# Patient Record
Sex: Female | Born: 1960 | Race: White | Hispanic: No | Marital: Single | Smoking: Former smoker
Health system: Southern US, Community
[De-identification: ages and names within clinical notes are randomized; demographics above are authoritative.]

## PROBLEM LIST (undated history)

## (undated) DIAGNOSIS — G8929 Other chronic pain: Secondary | ICD-10-CM

## (undated) DIAGNOSIS — E7800 Pure hypercholesterolemia, unspecified: Secondary | ICD-10-CM

## (undated) DIAGNOSIS — F32A Depression, unspecified: Secondary | ICD-10-CM

## (undated) DIAGNOSIS — F419 Anxiety disorder, unspecified: Secondary | ICD-10-CM

## (undated) DIAGNOSIS — M1990 Unspecified osteoarthritis, unspecified site: Secondary | ICD-10-CM

## (undated) DIAGNOSIS — I10 Essential (primary) hypertension: Secondary | ICD-10-CM

## (undated) DIAGNOSIS — K219 Gastro-esophageal reflux disease without esophagitis: Secondary | ICD-10-CM

## (undated) DIAGNOSIS — R519 Headache, unspecified: Secondary | ICD-10-CM

## (undated) DIAGNOSIS — C439 Malignant melanoma of skin, unspecified: Secondary | ICD-10-CM

## (undated) DIAGNOSIS — F319 Bipolar disorder, unspecified: Secondary | ICD-10-CM

## (undated) DIAGNOSIS — M797 Fibromyalgia: Secondary | ICD-10-CM

## (undated) DIAGNOSIS — F329 Major depressive disorder, single episode, unspecified: Secondary | ICD-10-CM

## (undated) DIAGNOSIS — R7301 Impaired fasting glucose: Secondary | ICD-10-CM

## (undated) DIAGNOSIS — I428 Other cardiomyopathies: Secondary | ICD-10-CM

## (undated) DIAGNOSIS — R569 Unspecified convulsions: Secondary | ICD-10-CM

## (undated) DIAGNOSIS — I219 Acute myocardial infarction, unspecified: Secondary | ICD-10-CM

## (undated) DIAGNOSIS — M549 Dorsalgia, unspecified: Secondary | ICD-10-CM

## (undated) HISTORY — PX: COLONOSCOPY: SHX174

## (undated) HISTORY — DX: Unspecified osteoarthritis, unspecified site: M19.90

## (undated) HISTORY — DX: Other cardiomyopathies: I42.8

## (undated) HISTORY — PX: ESOPHAGOGASTRODUODENOSCOPY: SHX1529

## (undated) HISTORY — DX: Unspecified convulsions: R56.9

## (undated) HISTORY — DX: Impaired fasting glucose: R73.01

## (undated) HISTORY — PX: OTHER SURGICAL HISTORY: SHX169

---

## 2004-10-23 ENCOUNTER — Ambulatory Visit: Payer: Self-pay | Admitting: Internal Medicine

## 2004-10-23 ENCOUNTER — Inpatient Hospital Stay (HOSPITAL_COMMUNITY): Admission: AD | Admit: 2004-10-23 | Discharge: 2004-10-27 | Payer: Self-pay | Admitting: Family Medicine

## 2004-12-20 HISTORY — PX: ABDOMINAL HYSTERECTOMY: SHX81

## 2005-02-10 ENCOUNTER — Inpatient Hospital Stay (HOSPITAL_COMMUNITY): Admission: RE | Admit: 2005-02-10 | Discharge: 2005-02-12 | Payer: Self-pay | Admitting: Obstetrics & Gynecology

## 2005-12-20 DIAGNOSIS — M1990 Unspecified osteoarthritis, unspecified site: Secondary | ICD-10-CM

## 2005-12-20 HISTORY — DX: Unspecified osteoarthritis, unspecified site: M19.90

## 2006-07-27 ENCOUNTER — Ambulatory Visit (HOSPITAL_COMMUNITY): Admission: RE | Admit: 2006-07-27 | Discharge: 2006-07-27 | Payer: Self-pay | Admitting: General Surgery

## 2006-09-19 DIAGNOSIS — R569 Unspecified convulsions: Secondary | ICD-10-CM

## 2006-09-19 HISTORY — DX: Unspecified convulsions: R56.9

## 2007-06-21 ENCOUNTER — Ambulatory Visit (HOSPITAL_COMMUNITY): Admission: RE | Admit: 2007-06-21 | Discharge: 2007-06-21 | Payer: Self-pay | Admitting: Obstetrics & Gynecology

## 2008-04-01 ENCOUNTER — Ambulatory Visit (HOSPITAL_COMMUNITY): Admission: RE | Admit: 2008-04-01 | Discharge: 2008-04-01 | Payer: Self-pay | Admitting: Family Medicine

## 2008-04-01 ENCOUNTER — Encounter: Payer: Self-pay | Admitting: Orthopedic Surgery

## 2008-06-12 ENCOUNTER — Ambulatory Visit: Payer: Self-pay | Admitting: Orthopedic Surgery

## 2008-06-17 ENCOUNTER — Telehealth: Payer: Self-pay | Admitting: Orthopedic Surgery

## 2008-06-27 ENCOUNTER — Ambulatory Visit (HOSPITAL_COMMUNITY): Admission: RE | Admit: 2008-06-27 | Discharge: 2008-06-27 | Payer: Self-pay | Admitting: Orthopedic Surgery

## 2008-06-27 ENCOUNTER — Telehealth: Payer: Self-pay | Admitting: Orthopedic Surgery

## 2008-07-08 ENCOUNTER — Ambulatory Visit: Payer: Self-pay | Admitting: Orthopedic Surgery

## 2008-07-12 ENCOUNTER — Ambulatory Visit (HOSPITAL_COMMUNITY): Admission: RE | Admit: 2008-07-12 | Discharge: 2008-07-12 | Payer: Self-pay | Admitting: Orthopedic Surgery

## 2008-07-12 ENCOUNTER — Ambulatory Visit: Payer: Self-pay | Admitting: Orthopedic Surgery

## 2008-07-16 ENCOUNTER — Encounter (HOSPITAL_COMMUNITY): Admission: RE | Admit: 2008-07-16 | Discharge: 2008-08-15 | Payer: Self-pay | Admitting: Orthopedic Surgery

## 2008-07-16 ENCOUNTER — Ambulatory Visit: Payer: Self-pay | Admitting: Orthopedic Surgery

## 2008-08-01 ENCOUNTER — Encounter: Payer: Self-pay | Admitting: Orthopedic Surgery

## 2008-08-06 ENCOUNTER — Ambulatory Visit: Payer: Self-pay | Admitting: Orthopedic Surgery

## 2008-09-18 ENCOUNTER — Telehealth: Payer: Self-pay | Admitting: Orthopedic Surgery

## 2008-12-20 DIAGNOSIS — C439 Malignant melanoma of skin, unspecified: Secondary | ICD-10-CM

## 2008-12-20 HISTORY — DX: Malignant melanoma of skin, unspecified: C43.9

## 2009-01-28 ENCOUNTER — Ambulatory Visit (HOSPITAL_COMMUNITY): Admission: RE | Admit: 2009-01-28 | Discharge: 2009-01-28 | Payer: Self-pay | Admitting: Surgery

## 2009-01-29 ENCOUNTER — Ambulatory Visit (HOSPITAL_COMMUNITY): Admission: RE | Admit: 2009-01-29 | Discharge: 2009-01-29 | Payer: Self-pay | Admitting: Family Medicine

## 2009-02-04 ENCOUNTER — Ambulatory Visit (HOSPITAL_BASED_OUTPATIENT_CLINIC_OR_DEPARTMENT_OTHER): Admission: RE | Admit: 2009-02-04 | Discharge: 2009-02-04 | Payer: Self-pay | Admitting: Surgery

## 2009-02-04 ENCOUNTER — Encounter (INDEPENDENT_AMBULATORY_CARE_PROVIDER_SITE_OTHER): Payer: Self-pay | Admitting: Surgery

## 2009-08-19 ENCOUNTER — Ambulatory Visit (HOSPITAL_COMMUNITY): Admission: RE | Admit: 2009-08-19 | Discharge: 2009-08-19 | Payer: Self-pay | Admitting: Obstetrics & Gynecology

## 2010-01-07 ENCOUNTER — Ambulatory Visit (HOSPITAL_COMMUNITY): Admission: RE | Admit: 2010-01-07 | Discharge: 2010-01-07 | Payer: Self-pay | Admitting: Family Medicine

## 2010-02-27 ENCOUNTER — Ambulatory Visit (HOSPITAL_COMMUNITY): Admission: RE | Admit: 2010-02-27 | Discharge: 2010-02-27 | Payer: Self-pay | Admitting: Family Medicine

## 2010-07-27 ENCOUNTER — Ambulatory Visit: Payer: Self-pay | Admitting: Gastroenterology

## 2010-07-28 ENCOUNTER — Encounter: Payer: Self-pay | Admitting: Internal Medicine

## 2010-08-20 HISTORY — PX: COLONOSCOPY: SHX174

## 2010-09-02 ENCOUNTER — Encounter: Payer: Self-pay | Admitting: Internal Medicine

## 2010-09-16 ENCOUNTER — Ambulatory Visit: Payer: Self-pay | Admitting: Internal Medicine

## 2010-09-16 ENCOUNTER — Ambulatory Visit (HOSPITAL_COMMUNITY): Admission: RE | Admit: 2010-09-16 | Discharge: 2010-09-16 | Payer: Self-pay | Admitting: Internal Medicine

## 2010-09-21 ENCOUNTER — Ambulatory Visit (HOSPITAL_COMMUNITY): Admission: RE | Admit: 2010-09-21 | Discharge: 2010-09-21 | Payer: Self-pay | Admitting: Family Medicine

## 2011-01-10 ENCOUNTER — Encounter: Payer: Self-pay | Admitting: Family Medicine

## 2011-01-21 NOTE — Letter (Signed)
Summary: TCS ORDER  TCS ORDER   Imported By: Ave Filter 09/02/2010 09:24:54  _____________________________________________________________________  External Attachment:    Type:   Image     Comment:   External Document

## 2011-01-21 NOTE — Letter (Signed)
Summary: REFERRAL FROM DR Lilyan Punt  REFERRAL FROM DR Lilyan Punt   Imported By: Rexene Alberts 07/28/2010 12:08:43  _____________________________________________________________________  External Attachment:    Type:   Image     Comment:   External Document

## 2011-01-21 NOTE — Assessment & Plan Note (Signed)
Summary: anemia,consult for tcs/ss   Visit Type:  Initial Consult Referring Provider:  Lilyan Punt Primary Care Provider:  Lilyan Punt  Chief Complaint:  anemia/consult for tcs.  History of Present Illness: 50 y/o caucasian female here for further evaluation of anemia.  Pt recalls being anemic intermittantly since middle school.  Hemoccult stools x 3 negative per pt.  Denies rectal bleeding or melena.  c/o hard stools twice per week.  Hx GERD on Prilosec daily asymptomatic.  Denies dysphagia/odynophagia.  Wt steadily increasing, gained 11# IN 3 weeks.  Denies hx blood transfusion or donation.  c/o easy bruising.  Denies epistaxis.  c/o occ fatigue. Excedrin Back & Body (250mg  ASA per cap) 4 daily 3 times/week.  Naprosyn 1-2 per week.    1/21 Hgb 11.7 ferritin 62 5/16 Hgb 11.3 6/9 Hgb 10.7, eos 6% LFTs normal  Current Problems (verified): 1)  Anemia, Normocytic, Chronic  (ICD-285.9) 2)  Knee Pain  (ICD-719.46) 3)  Knee, Arthritis, Degen.Lanetta Inch  (ICD-715.96) 4)  Derangement Meniscus  (ICD-717.5) 5)  Family History of Arthritis  (ICD-V17.7)  Current Medications (verified): 1)  K-Lor 20 Meq  Pack (Potassium Chloride) .... As Needed 2)  Hydrochlorothiazide 25 Mg  Tabs (Hydrochlorothiazide) .... As Needed 3)  Nortriptyline Hcl 50 Mg  Caps (Nortriptyline Hcl) .... One Tablet At Bedtime 4)  Omeprazole 20 Mg  Cpdr (Omeprazole) .... Take 1 Tablet By Mouth Once A Day 5)  Lamotrigine 25 Mg Tabs (Lamotrigine) .... Two Tablets Every Am 6)  Estradiol 2 Mg Tabs (Estradiol) .... One Tablet Daily 7)  Alprazolam 1 Mg Tabs (Alprazolam) .... Take 1 Tablet By Mouth Four Times A Day 8)  Fenofibrate Micronized 134 Mg Caps (Fenofibrate Micronized) .... Take 1 Tablet By Mouth Once A Day 9)  Trazodone Hcl 50 Mg Tabs (Trazodone Hcl) .... Take 1 Tab By Mouth At Bedtime 10)  Pravachol 20 Mg Tabs (Pravastatin Sodium) .... One Tablet in The Evening 11)  Equate Vison Formula .... One Tablet At Bedtime 12)   Naprosyn 500 Mg Tabs (Naproxen) .... One Tablet Daily As Needed 13)  Meclizine Hcl 25 Mg Tabs (Meclizine Hcl) .... As Needed 14)  Excedrin Back & Body 250-250 Mg Tabs (Acetaminophen-Aspirin Buffered) .... Two Tablets in The Am and Two  Tablets in The Pm  Allergies (verified): No Known Drug Allergies  Past History:  Past Medical History: migraines htn Bipolar depression (Dr Thomasena Edis) anxiety ANEMIA, NORMOCYTIC, CHRONIC (ICD-285.9) KNEE PAIN (ICD-719.46) KNEE, ARTHRITIS, DEGEN./OSTEO (ICD-715.96) DERANGEMENT MENISCUS (ICD-717.5) melanoma right knee seizure c diff 2005 on colonoscopy Dr Jena Gauss 10/2004 erosive reflux esophagitis EGD same date  Past Surgical History: complete hysterectomy knee surgery melanoma excision right leg  Family History: Family History of Arthritis Father (70) non-alcoholic, elevated liver enzymes? Mother/aunt/son-IBS  Social History: Patient is single, divorced x2 1 son-healthy disabled lives w/ boyfriend Patient is a former smoker. quit 10/99, 8pkyr Illicit Drug Use - yes, occ marijuana Alcohol Use - no Patient gets regular exercise. Smoking Status:  quit Does Patient Exercise:  yes Drug Use:  yes  Review of Systems General:  Denies fever, chills, sweats, anorexia, weakness, malaise, weight loss, and sleep disorder. CV:  Denies chest pains, angina, palpitations, syncope, dyspnea on exertion, orthopnea, PND, peripheral edema, and claudication. Resp:  Denies dyspnea at rest, dyspnea with exercise, cough, sputum, wheezing, coughing up blood, and pleurisy. GI:  Denies jaundice and fecal incontinence. GU:  Denies urinary burning, blood in urine, nocturnal urination, urinary frequency, urinary incontinence, and abnormal vaginal bleeding. MS:  Complains of joint pain / LOM and low back pain; denies joint swelling, joint stiffness, joint deformity, muscle weakness, muscle cramps, muscle atrophy, leg pain at night, leg pain with exertion, and shoulder  pain / LOM hand / wrist pain (CTS). Derm:  Denies rash, itching, dry skin, hives, moles, warts, and unhealing ulcers. Psych:  Denies depression, anxiety, memory loss, suicidal ideation, hallucinations, paranoia, phobia, and confusion. Heme:  Denies enlarged lymph nodes.  Vital Signs:  Patient profile:   50 year old female Height:      68 inches Weight:      210 pounds BMI:     32.05 Temp:     98.4 degrees F oral Pulse rate:   84 / minute BP sitting:   110 / 70  (left arm) Cuff size:   regular  Vitals Entered By: Cloria Spring LPN (July 27, 2010 2:08 PM)  Physical Exam  General:  obese.  pale.nad. Head:  normocephalic and atraumatic Eyes:  Sclera clear, no icterus. Ears:  Normal auditory acuity. Nose:  No deformity, discharge,  or lesions. Mouth:  No deformity or lesions, dentition normal. Neck:  Supple; no masses or thyromegaly. Lungs:  Clear throughout to auscultation. Heart:  Regular rate and rhythm; no murmurs, rubs,  or bruits. Abdomen:  Soft, nontender and nondistended. No masses, hepatosplenomegaly or hernias noted. Normal bowel sounds.obese, without guarding, and without rebound.   Rectal:  Normal exam.hemocult negative.   Msk:  Symmetrical with no gross deformities. Normal posture. Pulses:  Normal pulses noted. Extremities:  No clubbing, cyanosis, edema or deformities noted. Neurologic:  Alert and  oriented x4;  grossly normal neurologically. Skin:  Intact without significant lesions or rashes. Cervical Nodes:  No significant cervical adenopathy. Psych:  Alert and cooperative. Normal mood and affect.  Impression & Recommendations:  Problem # 1:  ANEMIA, NORMOCYTIC, CHRONIC (ICD-285.9) 50 y/o caucasian female w/ chronic normocytic anemia and 1 gram drop of hgb over the course of past 6 months.  Ferritin, B12 & TIBC pending.  Hemoccult negative x 4 now.  No evidence of gross/occult GI bleeding or iron deficiency at this time.  Most likely anemia of chronic disease.   Cannot r/o PUD, obscure GI bleed, SB avms, NSAID-enteropathy without further evaluation given significant ASA use, malignancy,r recurrent melanoma.     If evidence of IDA or heme positive, would need colonoscopy, followed by EGD/SB capsule if no source is found.  If heme negative, without evidence of IDA, would consider hematology/oncology evaluation.  Await pending labs.  Orders: Hemoccult Guaiac-1 spec.(in office) ([id number])  Other Orders: Consultation Level III (16109) Consultation Level III (60454)  Patient Instructions: 1)  Tylenol Arthritis instead of excedrin 2)  request above labs from Dr Fletcher Anon office  Appended Document: anemia,consult for tcs/ss Please see if ferritin, B12, TIBC have been done at Dr Fletcher Anon.  Thanks  Appended Document: anemia,consult for tcs/ss LABS ARE IN YOUR BOX AS OF TODAY  Appended Document: anemia,consult for tcs/ss 6/21 iron 116, ferritin 58, b12 450, tibc normal, Hgb 10.7  Appended Document: anemia,consult for tcs/ss Discussed w/ RMR.  Pt needs colonscopy w/ him.  Colonoscopy to be performed by Dr. Suszanne Conners Rourk in the near future.  I have discussed risks and benefits which include, but are not limited to, bleeding, infection, perforation, or medication reaction.  The patient agrees with this plan and consent will be obtained.  Please schedule  Appended Document: anemia,consult for tcs/ss Pt scheduled for tcs 09/16/10@12 :30  Pt aware of appt.

## 2011-04-06 LAB — POCT HEMOGLOBIN-HEMACUE: Hemoglobin: 13.1 g/dL (ref 12.0–15.0)

## 2011-05-04 NOTE — H&P (Signed)
Tara Dickson, Tara Dickson             ACCOUNT NO.:  [id number]   MEDICAL RECORD NO.:  [id number]          PATIENT TYPE:  AMB   LOCATION:  DAY                           FACILITY:  [facility]   PHYSICIAN:  Vickki Hearing, M.D.DATE OF BIRTH:  [birth year]   DATE OF ADMISSION:  DATE OF DISCHARGE:  LH                              HISTORY & PHYSICAL   CHIEF COMPLAINT:  Left knee pain.   HISTORY:  This is a 50 year old female who presented with complaints of  pain in the medial side of the left knee with trouble doing routine  activities.  She had pain for over 6 months without any injury.  This  was associated with intermittent swelling.  She was treated with Lodine,  but due to a Hemoccult test pending, she came off the Lodine.  She  denies any serious back pain, just mild pain after standing for long  periods.  She complains that the knee does give out at times.  She says  that something needs to be done because I can't do my normal chores.  X-rays were done on April 01, 2008.  They showed no major findings.  She  did have an MRI on June 27, 2008 which showed an indistinct radial tear  of the posterior horn of the medial meniscus with small knee effusion.  There was increased signal on T2 weighted images along the deep fibers  of the medial collateral ligament.  There was mild patellofemoral  chondromalacia on the medial facet and thinning of the cartilage on the  medial side of the joint.   ALLERGIES:  She has no known drug allergies.   PAST MEDICAL HISTORY:  1. History of migraine headaches.  2. Hypertension.  3. Depression.  4. Anxiety.   SURGERIES:  Hysterectomy.   FAMILY HISTORY:  Arthritis and cancer.   SOCIAL HISTORY:  She is single.  She does not smoke or drink.   REVIEW OF SYSTEMS:  Weight gain, shortness of breath, fatigue,  dizziness, weakness and migraine headaches.  Every other system was  normal.   MEDICATIONS:  1. Premarin 0.625 mg a day.  2. Seroquel 50 mg a  day.  3. K-Lor 20 mEq a day.  4. Hydrochlorothiazide 25 mg a day.  5. Nortriptyline 50 mg a day.  6. Depakote ER 500 mg a day.  7. Alprazolam 0.5 mg q.8h. p.r.n.  8. Omeprazole 20 mg a day.  9. Iron supplements.  10.Fish oil.  11.EstraBlend supplement.  12.Lysine.   PHYSICAL EXAMINATION:  VITAL SIGNS:  Normal.  GENERAL APPEARANCE:  She is well-developed, well-nourished.  Normal body  habitus.  No deformities.  Normal grooming.  HEENT:  Head normocephalic,  atraumatic.  Eyes - pupils equal, round and reactive to light and  accommodation.  Extraocular movements intact.  Fundi benign.  Conjunctivae, sclerae clear.  Ears - tympanic membranes intact and  clear.  Nose - no deformity, discharge, inflammation or lesions.  Mouth  - no lesions normal to dentition.  NECK:  No masses.  No thyromegaly.  No abnormal nodes.  LUNGS:  Clear bilaterally to auscultation.  HEART:  Rate and rhythm normal.  ABDOMEN:  Bowel sounds normal, soft, nontender abdomen with no masses or  organomegaly.  Pulses are normal in all four extremities with no  clubbing, cyanosis, edema.  NEUROLOGIC:  No focal deficits on neurologic exam.  Cranial nerves  intact.  Reflexes and coordination normal.  Muscle strength and muscle  tone normal.  SKIN:  Intact with no lesions.  PSYCHIATRIC EVALUATION:  She is alert, cooperative.  Normal mood,  affect, attention span and concentration.  EXTREMITIES:  Right knee examination normal.  Left knee examination  reveals full range of motion with a positive McMurray sign and medial  joint line tenderness. The ligamnent exam was normal. There was  excellent motor function   IMPRESSION:  Torn medial meniscus of the left knee with mild  degenerative arthritis.   PLAN:  Arthroscopy of the left knee, partial medial meniscectomy.   Informed consent process was discussed with the patient including risk  of bleeding, infection, neurovascular injury.  Diagnosis and reason for  surgery  has been explained.  The patient has been given the option of  further nonoperative treatment.  She should expect 90% pain relief.  There is a chance of stiffness and swelling.   Surgery is scheduled for Friday with a postop on Tuesday.      Vickki Hearing, M.D.  Electronically Signed     SEH/MEDQ  D:  07/11/2008  T:  07/11/2008  Job:  [id number]   cc:   [facility]

## 2011-05-04 NOTE — Op Note (Signed)
Tara Dickson, Tara Dickson             ACCOUNT NO.:  [id number]   MEDICAL RECORD NO.:  [id number]          PATIENT TYPE:  AMB   LOCATION:  DAY                           FACILITY:  [facility]   PHYSICIAN:  Vickki Hearing, M.D.DATE OF BIRTH:  Nov 20, 1961   DATE OF PROCEDURE:  07/12/2008  DATE OF DISCHARGE:                               OPERATIVE REPORT   This patient presented as a 50 year old female with atraumatic onset of  medial pain in her left knee.  She was treated with Lodine and Vicodin,  did not improve.  She had pain for over 6 months.  Her symptoms got  worse and started having swelling, and inability to do her activities of  daily living.  She eventually had an MRI of the knee, which showed a  torn posterior horn of the medial meniscus, small knee effusion,  patellofemoral chondromalacia on the medial side, and thinning of the  medial compartment articular cartilage.  After failure of nonoperative  treatment, she presented for surgical treatment.   PREOPERATIVE DIAGNOSIS:  Torn medial meniscus of the left knee with  degenerative arthritis.   POSTOPERATIVE DIAGNOSIS:  Torn medial meniscus of the left knee with  degenerative arthritis.   PROCEDURES:  1. Arthroscopy of the left knee with partial medial meniscectomy  2. Chondroplasty of the medial facet of the patella.   SURGEON:  Vickki Hearing, MD.   ASSISTANT:  No assistants.   ANESTHETIC:  General.   OPERATIVE FINDINGS:  The posterior horn medial meniscus was torn.  It  was grade 1 chondromalacia of the medial femoral condyle and tibial  plateau.  It was grade 2 chondromalacia of the medial facet of the  patella.  The ACL, PCL, and lateral compartment was intact.  There was a  symptomatic medial plica.   SPECIMEN:  No specimens.  Case was clean minimal.   ESTIMATED BLOOD LOSS:  No blood loss.   COMPLICATIONS:  No complications.   COUNTS:  Correct.  The patient went to PACU in good condition.   TECHNIQUE:   The patient was first identified in the preop area as  Tara Dickson.  Her left knee was marked by her for surgery and  then I countersigned.  I checked her chart, updated her history and  physical.  She was given a gram of Ancef and taken to the operating room  where she had a general anesthetic was induced smoothly.  No  complications.   In the supine position with an arthroscopic leg holder and the right leg  was padded.  The left leg was prepped with DuraPrep and draped  sterilely.   Time-out was completed.   Lateral portal was established.  Diagnostic arthroscopy was performed  then repeated with a medial portal established and a probe to the medial  portal to palpate the intra-articular structures.  Findings included  torn medial meniscus on the posterior horn, there was grade 1  chondromalacia medial femoral condyle, and there was grade 2  chondromalacia medial facet of the patella.  There was a symptomatic  plica, which did not abrade the femoral condyle  on flexion and extension   Combination of arthroscopic instruments arthroscopic shaver and a  biters, an ArthroCare 150 degrees was used to resect a meniscal tear  balance of the meniscus and a chondroplasty was performed on the medial  facet of patella with a shaver.  The knee was irrigated and suctioned,  cleaned out of any debris, and then the portals were closed with 3-0  nylon.  A 20 mL of 0.5% Sensorcaine with 1:200,000 epinephrine solution  was injected into the knee.  The knee was dressed sterilely, cryo cuff  was applied.  The patient was extubated and taken to the recovery room  in stable condition.  She has a postop appointment for Tuesday.  She  will be discharged on Lorcet Plus and Phenergan.  She is full  weightbearing.      Vickki Hearing, M.D.  Electronically Signed     SEH/MEDQ  D:  07/12/2008  T:  07/12/2008  Job:  [id number]

## 2011-05-04 NOTE — Op Note (Signed)
NAMEJESSY, Tara Dickson             ACCOUNT NO.:  [id number]   MEDICAL RECORD NO.:  [id number]          PATIENT TYPE:  AMB   LOCATION:  DSC                          FACILITY:  [facility]   PHYSICIAN:  Thornton Park. Daphine Deutscher, MD  DATE OF BIRTH:  Mar 21, 1961   DATE OF PROCEDURE:  02/04/2009  DATE OF DISCHARGE:                               OPERATIVE REPORT   PREOPERATIVE DIAGNOSIS:  A 3.5 mm melanoma in the right popliteal  region.   PROCEDURE:  Right inguinal sentinel lymph node mapping and sentinel  lymph node biopsy, rotation from supine to prone position, and wide  excision with 2-cm margins of melanoma site, right popliteal space with  partial closure and split-thickness skin grafting from posterior aspect.   SURGEON:  Thornton Park. Daphine Deutscher, MD   ANESTHESIA:  General endotracheal in both supine and prone positions.   DESCRIPTION OF PROCEDURE:  The patient was taken to room 5 at [facility].  On February 04, 2009, she was given general anesthesia.  Preoperative informed consent was obtained regarding wide excision with  probable split-thickness skin grafting and lymph node mapping and  biopsy.  First we prepped the groin where I had previously mapped the  groin region and found a hot area.  Once this was prepped and draped, I  made a transverse incision and carried this down to generous fatty  tissue to find a hot node.  I then previously injected with some blue  dye and little bit of blue dye was in the node as well.  It was excised  using clips to control any kind of the lymphovascular bleeding and none  was present.  It was hot when I removed it and there was background and  zero when it was gone.  It was sent for permanent sections.  The wound  was then closed in layers with 4-0 Vicryl and then with Dermabond.   The patient was then rotated onto a new operative bed in the prone  position.  I carefully mapped out the melanoma and excised it with a 2-  cm margin.  This was  unfortunately in the crease of her leg but I went  ahead and excised it down to the fascia and got good margins and  oriented those with a marking suture laterally.  It was taken off the  field.  It was then closed at either end for a distance of approximately  1 cm.  A 2-inch wide 6-cm long graft was then harvested from the  posterior aspect of the thigh.  A nice and very good 10,000th of an inch  of graft was obtained and were run to the 1.5 x 0.1 measure and then  secured to the site with many interrupted 4-0 Vicryls.  Tisseel was used  at the end to help seal it to the wound and a bulky dressing was placed  in it and tied down with four 3-0 nylons.  This was tied down over a parachute silk and a bolus dressing.  The  entire area was then wrapped after Tegaderm was placed on the donor site  with an Ace wrap and was placed in a knee immobilizer.  The patient was  obstructed to come back and see me in 3 days in the office and was given  Tylox for pain.      Thornton Park Daphine Deutscher, MD  Electronically Signed     MBM/MEDQ  D:  02/04/2009  T:  02/04/2009  Job:  [id number]   cc:   Donna Bernard, M.D.  Nita Sells, M.D.

## 2011-05-04 NOTE — Procedures (Signed)
NAMECAMELIA, STELZNER             ACCOUNT NO.:  [id number]   MEDICAL RECORD NO.:  [id number]          PATIENT TYPE:  OUT   LOCATION:  RESP                          FACILITY:  [facility]   PHYSICIAN:  Kofi A. Gerilyn Pilgrim, M.D. DATE OF BIRTH:  1961-02-08   DATE OF PROCEDURE:  DATE OF DISCHARGE:  01/29/2009                              EEG INTERPRETATION   REFERRING PHYSICIAN:  Scott A. Gerda Diss, MD   HISTORY:  This is a 50 year old lady who presents with spells suspicious  for seizures.   MEDICATIONS:  Pamelor, Xanax, Seroquel, and Prilosec.   ANALYSIS:  A 16-channel recording is conducted for approximately 20  minutes.  There is a well-formed posterior rhythm of 13 Hz, which  attenuates with eye opening.  Awake and drowsy activities are recorded.  Photic stimulation and hyperventilation are carried out without  significant changes in the background activity.  There is no focal or  lateralized slowing.  There is no epileptiform activity.   IMPRESSION:  Normal recording.  A single recording does not rule out  epileptic seizures however.  If clinically indicated, a prolonged or  sleep deprived recording may be useful.      Kofi A. Gerilyn Pilgrim, M.D.  Electronically Signed     KAD/MEDQ  D:  02/03/2009  T:  02/03/2009  Job:  [id number]

## 2011-05-07 NOTE — Discharge Summary (Signed)
NAMECATY, TESSLER             ACCOUNT NO.:  [id number]   MEDICAL RECORD NO.:  [id number]          PATIENT TYPE:  INP   LOCATION:  A417                          FACILITY:  [facility]   PHYSICIAN:  Lazaro Arms, M.D.   DATE OF BIRTH:  Aug 18, 1961   DATE OF ADMISSION:  02/10/2005  DATE OF DISCHARGE:  02/24/2006LH                                 DISCHARGE SUMMARY   DISCHARGE DIAGNOSES:  1.  Status post total abdominal hysterectomy bilateral salpingo-      oophorectomy.  2.  Unremarkable postoperative course.   PROCEDURE:  TAH BSO.   HISTORY AND PHYSICAL:  Please refer the transcribed history and physical and  operative report for details of admission to the hospital.   HOSPITAL COURSE:  The patient was admitted after surgery, which went well.  She tolerated clear liquids and now a regular diet.  She is voiding without  symptoms.  She is ambulatory.  She is tolerating oral pain medicines.  Her  incision is clean, dry, and intact.  Her abdominal exam is normal  postoperatively.  She is discharged home this morning.   DISCHARGE MEDICATIONS:  1.  Tylox.  2.  Motrin.   FOLLOW UP:  In my office on Tuesday for an incision check and removal of  staples.   DISCHARGE INSTRUCTIONS:  She is given routine instructions and precautions.  Instructions for return or to call the office.      LHE/MEDQ  D:  02/12/2005  T:  02/12/2005  Job:  [id number]

## 2011-05-07 NOTE — Consult Note (Signed)
NAMEJAYLN, Tara Dickson             ACCOUNT NO.:  [id number]   MEDICAL RECORD NO.:  [id number]          PATIENT TYPE:  INP   LOCATION:  A340                          FACILITY:  [facility]   PHYSICIAN:  Scott A. Gerda Diss, MD    DATE OF BIRTH:  1961/03/17   DATE OF CONSULTATION:  11/19/2004  DATE OF DISCHARGE:  10/27/2004                                   CONSULTATION   DISCHARGE DIAGNOSES:  1.  Clostridium difficile toxin intestinal infection.  2.  Gastroenteritis secondary to #1.  3.  Hypokalemia.  4.  Reflux.  5.  Anemia secondary to menorrhagia.  6.  Bronchopneumonia.14   HOSPITAL COURSE:  The patient was admitted in with vomiting, coughing, not  feeling good, diarrhea, abdominal pain and discomfort.  She had been seen in  the office a week prior to admission with nausea and discomfort.  She was  seen twice in the emergency department.  Essentially, she was a failed  outpatient management.  The patient came in and had a chest x-ray on the 4th  that showed a pneumonia.  She had blood work that showed a 17,000 white  count.  Her potassium was 3.2.  Her urine had some blood in it.  She was  admitted in and put on IV fluids as well as having antibiotics and had a  scan done.  It showed a thickened colon consistent with colitis.  Stool  tests were ordered.  Clostridium difficile toxin came back positive.  She  was treated with Flagyl.  She also had a colonoscopy done which showed a  normal colon with mild inflammatory changes associated with a localized left-  sided colitis, and an EGD which overall did not show any ulcers.  She  gradually improved, and she was able to be discharged to home on Protonix  once twice a day, and then also to Flagyl.  She was a follow up in the  office in one to two weeks, to be rechecked sooner if any problems.  She was  to avoid tomato base products, caffeine and chocolates.  As should be noted,  her leukocytosis did improve.     Scot   SAL/MEDQ  D:   11/19/2004  T:  11/19/2004  Job:  [id number]

## 2011-05-07 NOTE — Group Therapy Note (Signed)
NAMELAKEISA, HENINGER             ACCOUNT NO.:  [id number]   MEDICAL RECORD NO.:  [id number]          PATIENT TYPE:  INP   LOCATION:  A340                          FACILITY:  [facility]   PHYSICIAN:  Scott A. Gerda Diss, MD    DATE OF BIRTH:  1961/09/03   DATE OF PROCEDURE:  10/26/2004  DATE OF DISCHARGE:                                   PROGRESS NOTE   The patient overall feels pretty good.  Has some soreness in the left side  of her abdomen.  Denies any nausea, vomiting.  Her Clostridium difficile was  positive.  Will make sure she is on Flagyl.  The other medicine can be  discontinued.  She has an EGD and colonoscopy today.  Expect discharge  tomorrow.     Scot   SAL/MEDQ  D:  10/26/2004  T:  10/26/2004  Job:  [id number]

## 2011-06-28 ENCOUNTER — Ambulatory Visit (HOSPITAL_COMMUNITY)
Admission: RE | Admit: 2011-06-28 | Discharge: 2011-06-28 | Disposition: A | Discharge status: Home / Self Care | Payer: Medicare Other | Source: Ambulatory Visit | Attending: Family Medicine | Admitting: Family Medicine

## 2011-06-28 ENCOUNTER — Other Ambulatory Visit: Payer: Self-pay | Admitting: Family Medicine

## 2011-06-28 DIAGNOSIS — R05 Cough: Secondary | ICD-10-CM | POA: Insufficient documentation

## 2011-06-28 DIAGNOSIS — Z85820 Personal history of malignant melanoma of skin: Secondary | ICD-10-CM

## 2011-06-28 DIAGNOSIS — R059 Cough, unspecified: Secondary | ICD-10-CM | POA: Insufficient documentation

## 2011-09-01 IMAGING — CR DG LUMBAR SPINE COMPLETE 4+V
5 series · 5 of 5 positions shown · non-contrast
Comparison: None

CLINICAL DATA: Lumbar/low back pain

LUMBAR SPINE - COMPLETE 4+ VIEW

[view not recorded (1 of 5)]
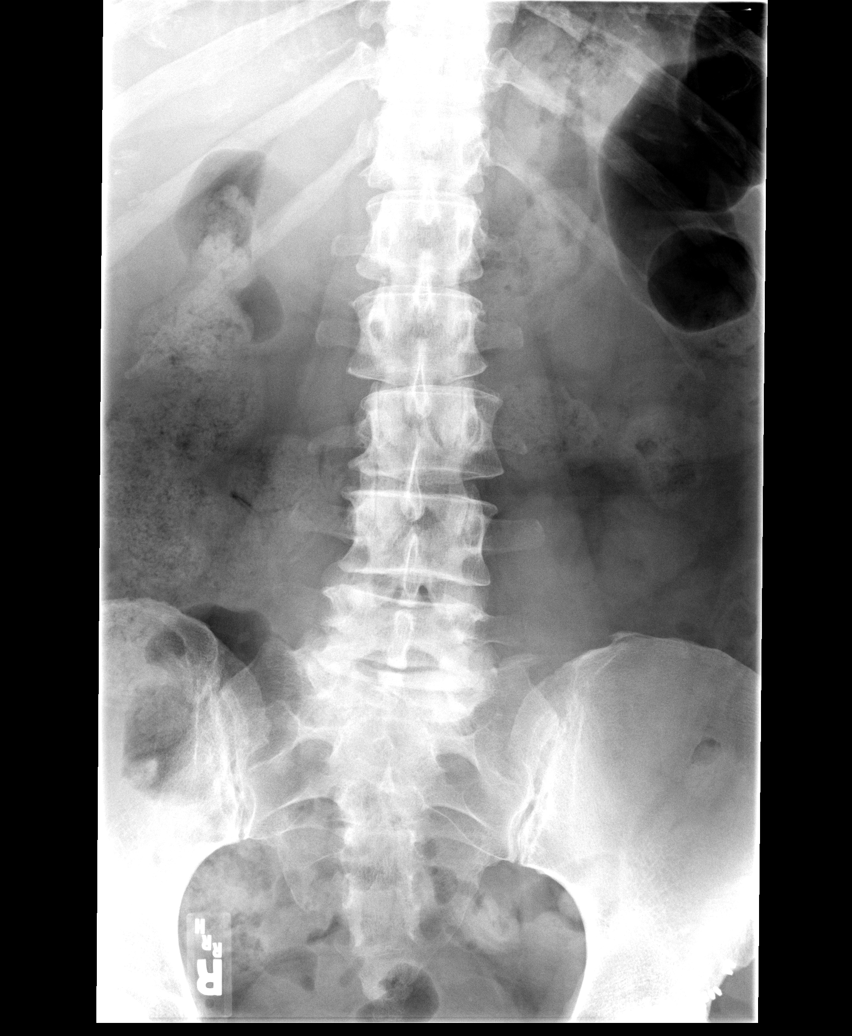

[view not recorded (2 of 5)]
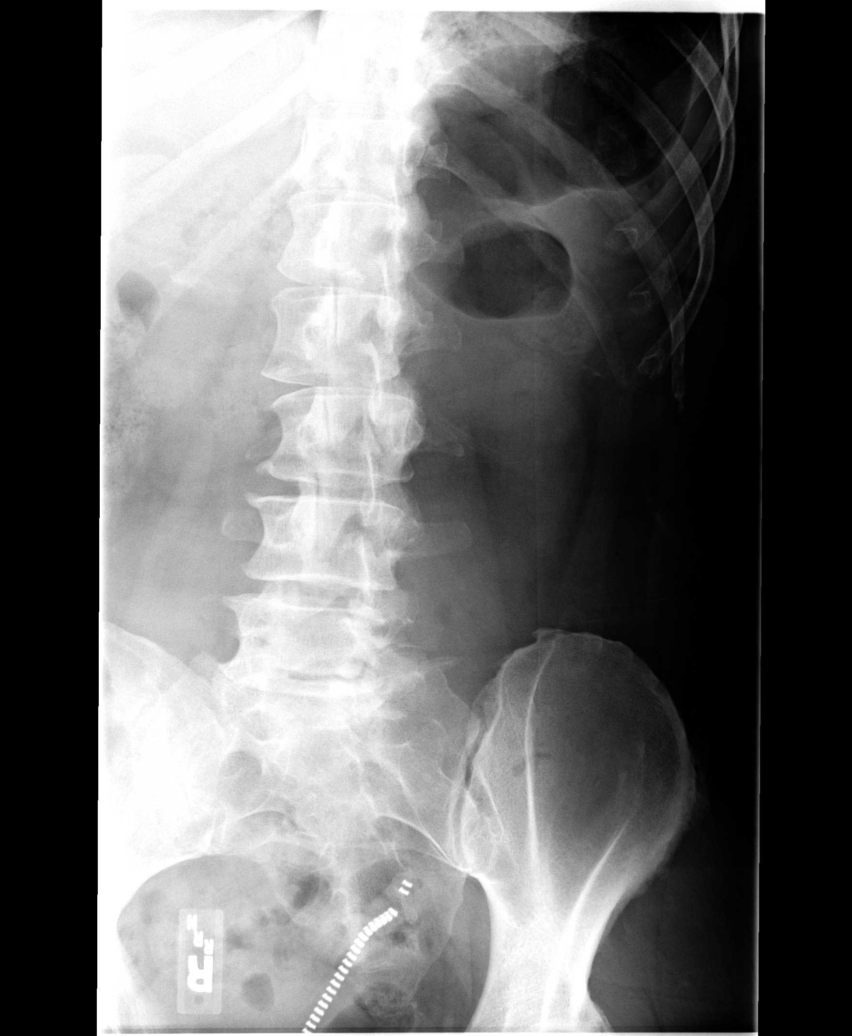

[view not recorded (3 of 5)]
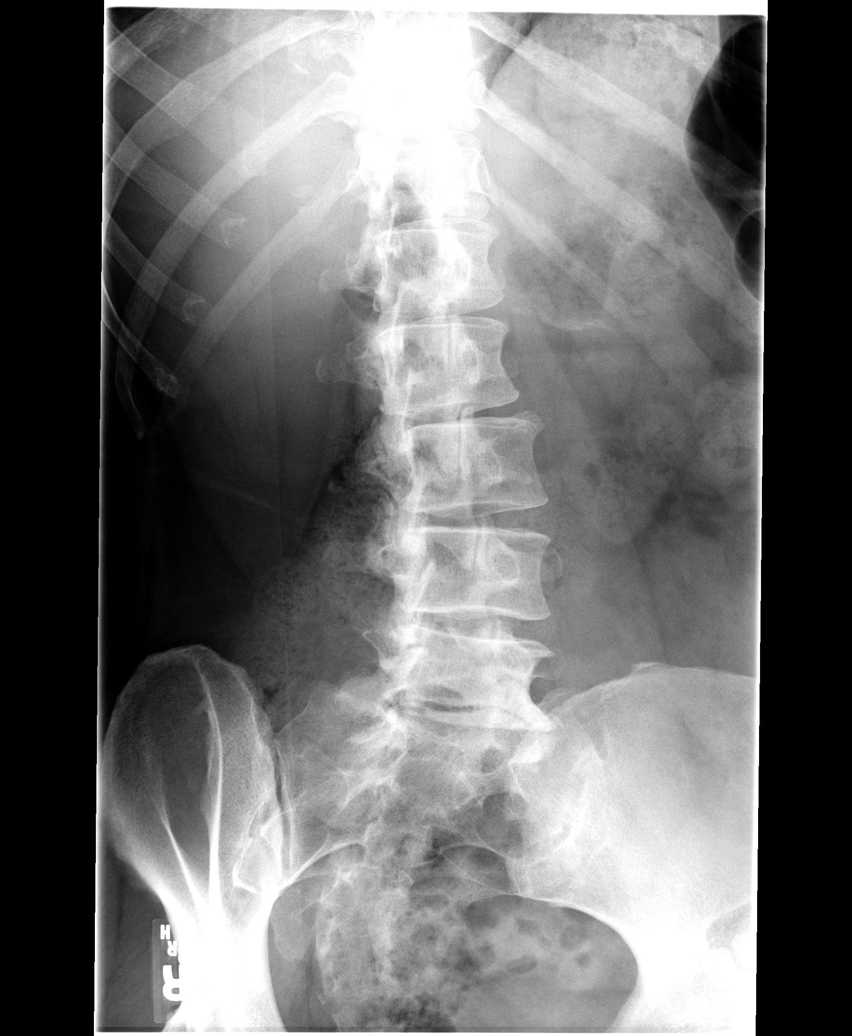

[view not recorded (4 of 5)]
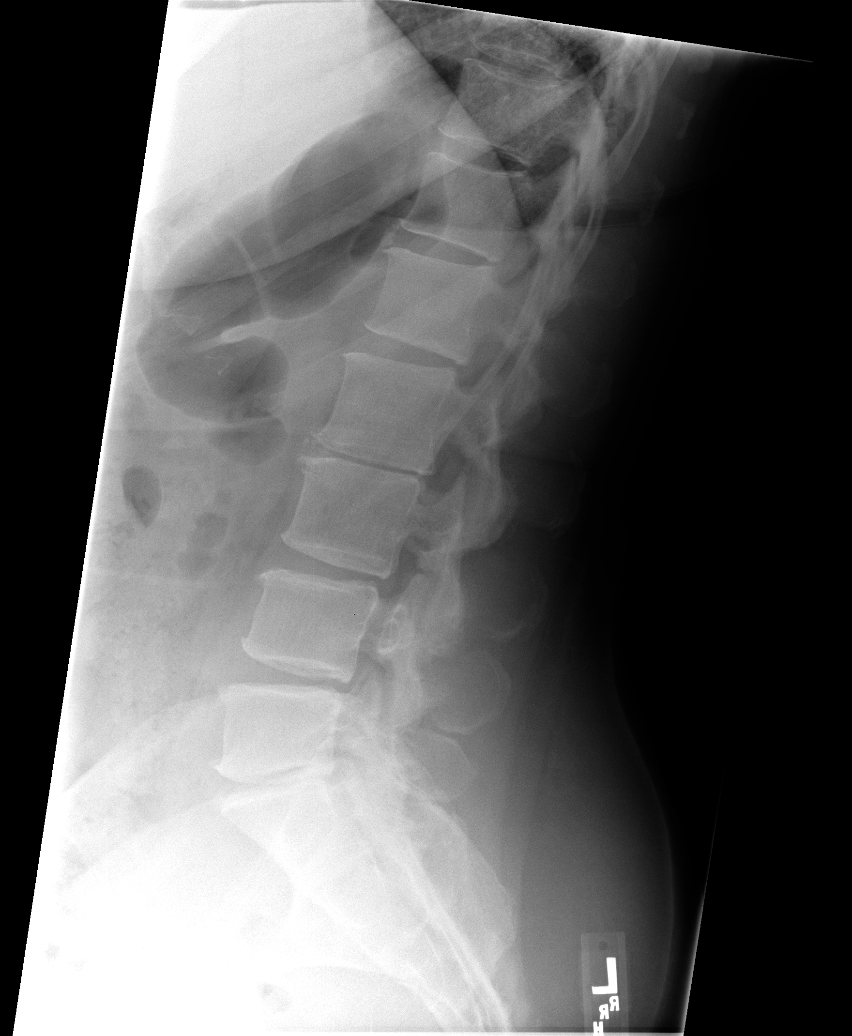

[view not recorded (5 of 5)]
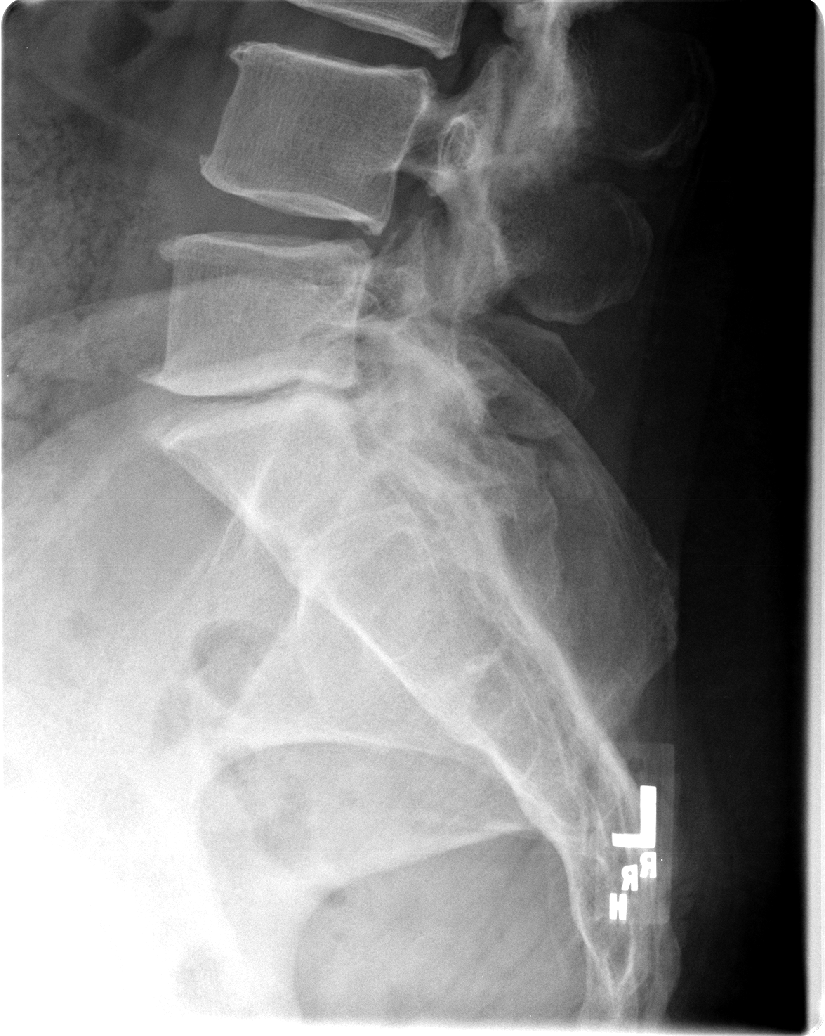

[5 of 5 positions shown; findings below may reference images not displayed]

FINDINGS: Five non-rib bearing lumbar type vertebrae.
Minimal levoconvex scoliosis lumbar spine apex L3.
Bones appear slightly demineralized.
Disc space narrowing at L2-L3, L5-S1, less at remaining levels.
Vertebral body heights maintained without fracture or subluxation.
No spondylolysis.
Minimal scattered end plate spur formation.
Facet degenerative changes lower lumbar spine.
SI joints symmetric.
IMPRESSION: Degenerative disc and facet disease changes of the lumbar spine.
Minimal scoliosis.
No acute abnormalities.

## 2011-09-17 LAB — RAPID URINE DRUG SCREEN, HOSP PERFORMED
Amphetamines: NOT DETECTED
Barbiturates: NOT DETECTED
Benzodiazepines: POSITIVE — AB
Cocaine: NOT DETECTED
Opiates: POSITIVE — AB
Tetrahydrocannabinol: POSITIVE — AB

## 2011-09-17 LAB — BASIC METABOLIC PANEL
BUN: 8
CO2: 27
Calcium: 9.2
Chloride: 105
Creatinine, Ser: 0.87
GFR calc Af Amer: >60
GFR calc non Af Amer: >60
Glucose, Bld: 81
Potassium: 4.2
Sodium: 137

## 2011-09-17 LAB — HEMOGLOBIN AND HEMATOCRIT, BLOOD
HCT: 33.5 — ABNORMAL LOW
Hemoglobin: 11.5 — ABNORMAL LOW

## 2011-11-01 ENCOUNTER — Other Ambulatory Visit: Payer: Self-pay | Admitting: Obstetrics & Gynecology

## 2011-11-01 DIAGNOSIS — Z139 Encounter for screening, unspecified: Secondary | ICD-10-CM

## 2011-11-09 ENCOUNTER — Ambulatory Visit (HOSPITAL_COMMUNITY)
Admission: RE | Admit: 2011-11-09 | Discharge: 2011-11-09 | Disposition: A | Discharge status: Home / Self Care | Payer: Medicare Other | Source: Ambulatory Visit | Attending: Obstetrics & Gynecology | Admitting: Obstetrics & Gynecology

## 2011-11-09 DIAGNOSIS — Z139 Encounter for screening, unspecified: Secondary | ICD-10-CM

## 2011-11-09 DIAGNOSIS — Z1231 Encounter for screening mammogram for malignant neoplasm of breast: Secondary | ICD-10-CM | POA: Insufficient documentation

## 2012-01-03 DIAGNOSIS — R011 Cardiac murmur, unspecified: Secondary | ICD-10-CM

## 2012-01-05 DIAGNOSIS — R079 Chest pain, unspecified: Secondary | ICD-10-CM

## 2012-10-03 ENCOUNTER — Other Ambulatory Visit: Payer: Self-pay | Admitting: Family Medicine

## 2012-10-03 DIAGNOSIS — IMO0001 Reserved for inherently not codable concepts without codable children: Secondary | ICD-10-CM

## 2012-11-09 ENCOUNTER — Ambulatory Visit (HOSPITAL_COMMUNITY)
Admission: RE | Admit: 2012-11-09 | Discharge: 2012-11-09 | Disposition: A | Discharge status: Home / Self Care | Payer: Medicare Other | Source: Ambulatory Visit | Attending: Family Medicine | Admitting: Family Medicine

## 2012-11-09 DIAGNOSIS — Z1231 Encounter for screening mammogram for malignant neoplasm of breast: Secondary | ICD-10-CM | POA: Insufficient documentation

## 2012-11-09 DIAGNOSIS — IMO0001 Reserved for inherently not codable concepts without codable children: Secondary | ICD-10-CM

## 2012-11-09 LAB — HM MAMMOGRAPHY

## 2012-12-20 DIAGNOSIS — I428 Other cardiomyopathies: Secondary | ICD-10-CM

## 2012-12-20 HISTORY — DX: Other cardiomyopathies: I42.8

## 2013-04-03 ENCOUNTER — Other Ambulatory Visit: Payer: Self-pay | Admitting: *Deleted

## 2013-07-27 ENCOUNTER — Other Ambulatory Visit: Payer: Self-pay | Admitting: Family Medicine

## 2013-07-30 ENCOUNTER — Other Ambulatory Visit: Payer: Self-pay | Admitting: Family Medicine

## 2013-09-19 DIAGNOSIS — I219 Acute myocardial infarction, unspecified: Secondary | ICD-10-CM

## 2013-09-19 HISTORY — DX: Acute myocardial infarction, unspecified: I21.9

## 2013-10-11 ENCOUNTER — Telehealth: Payer: Self-pay | Admitting: Family Medicine

## 2013-10-11 MED ORDER — METAXALONE 800 MG PO TABS: 800.0000 mg | ORAL_TABLET | Freq: Three times a day (TID) | ORAL | Status: DC

## 2013-10-11 NOTE — Telephone Encounter (Signed)
Skelaxin 800 one 3 times a day when necessary spasm cautioned drowsiness #21 followup if ongoing troubles

## 2013-10-11 NOTE — Telephone Encounter (Signed)
Patient says that when she woke up this morning she felt like she pulled something in her back. She said when she sits down she gets a sharp pain when she tries to get back up. She is hoping to have something called in to [facility]

## 2013-10-11 NOTE — Telephone Encounter (Signed)
Rx sent electronically to [facility]. Patient notified. 

## 2013-10-18 ENCOUNTER — Inpatient Hospital Stay (HOSPITAL_COMMUNITY)
Admission: AD | Admit: 2013-10-18 | Discharge: 2013-10-21 | DRG: 280 | Disposition: A | Discharge status: Home / Self Care | Payer: Medicare Other | Source: Other Acute Inpatient Hospital | Attending: Interventional Cardiology | Admitting: Interventional Cardiology

## 2013-10-18 ENCOUNTER — Encounter (HOSPITAL_COMMUNITY)
Admission: AD | Disposition: A | Discharge status: Home / Self Care | Payer: Self-pay | Source: Other Acute Inpatient Hospital | Attending: Interventional Cardiology

## 2013-10-18 ENCOUNTER — Encounter (HOSPITAL_COMMUNITY): Payer: Self-pay | Admitting: *Deleted

## 2013-10-18 DIAGNOSIS — I5021 Acute systolic (congestive) heart failure: Secondary | ICD-10-CM | POA: Diagnosis present

## 2013-10-18 DIAGNOSIS — I213 ST elevation (STEMI) myocardial infarction of unspecified site: Secondary | ICD-10-CM

## 2013-10-18 DIAGNOSIS — R4182 Altered mental status, unspecified: Secondary | ICD-10-CM | POA: Diagnosis present

## 2013-10-18 DIAGNOSIS — Z79899 Other long term (current) drug therapy: Secondary | ICD-10-CM

## 2013-10-18 DIAGNOSIS — I214 Non-ST elevation (NSTEMI) myocardial infarction: Secondary | ICD-10-CM | POA: Diagnosis present

## 2013-10-18 DIAGNOSIS — Z85820 Personal history of malignant melanoma of skin: Secondary | ICD-10-CM

## 2013-10-18 DIAGNOSIS — F319 Bipolar disorder, unspecified: Secondary | ICD-10-CM | POA: Diagnosis present

## 2013-10-18 DIAGNOSIS — J690 Pneumonitis due to inhalation of food and vomit: Secondary | ICD-10-CM | POA: Clinically undetermined

## 2013-10-18 DIAGNOSIS — E876 Hypokalemia: Secondary | ICD-10-CM | POA: Diagnosis present

## 2013-10-18 DIAGNOSIS — I509 Heart failure, unspecified: Secondary | ICD-10-CM | POA: Diagnosis present

## 2013-10-18 DIAGNOSIS — I2109 ST elevation (STEMI) myocardial infarction involving other coronary artery of anterior wall: Secondary | ICD-10-CM | POA: Diagnosis present

## 2013-10-18 DIAGNOSIS — R7989 Other specified abnormal findings of blood chemistry: Secondary | ICD-10-CM

## 2013-10-18 DIAGNOSIS — I059 Rheumatic mitral valve disease, unspecified: Secondary | ICD-10-CM

## 2013-10-18 DIAGNOSIS — F411 Generalized anxiety disorder: Secondary | ICD-10-CM | POA: Diagnosis present

## 2013-10-18 DIAGNOSIS — Z87891 Personal history of nicotine dependence: Secondary | ICD-10-CM

## 2013-10-18 DIAGNOSIS — I5181 Takotsubo syndrome: Secondary | ICD-10-CM

## 2013-10-18 DIAGNOSIS — I429 Cardiomyopathy, unspecified: Secondary | ICD-10-CM

## 2013-10-18 DIAGNOSIS — E870 Hyperosmolality and hypernatremia: Secondary | ICD-10-CM | POA: Diagnosis not present

## 2013-10-18 DIAGNOSIS — E785 Hyperlipidemia, unspecified: Secondary | ICD-10-CM | POA: Diagnosis present

## 2013-10-18 DIAGNOSIS — G40909 Epilepsy, unspecified, not intractable, without status epilepticus: Secondary | ICD-10-CM | POA: Diagnosis present

## 2013-10-18 DIAGNOSIS — I200 Unstable angina: Secondary | ICD-10-CM

## 2013-10-18 DIAGNOSIS — E669 Obesity, unspecified: Secondary | ICD-10-CM

## 2013-10-18 DIAGNOSIS — I10 Essential (primary) hypertension: Secondary | ICD-10-CM | POA: Diagnosis present

## 2013-10-18 HISTORY — DX: Major depressive disorder, single episode, unspecified: F32.9

## 2013-10-18 HISTORY — DX: Bipolar disorder, unspecified: F31.9

## 2013-10-18 HISTORY — PX: LEFT AND RIGHT HEART CATHETERIZATION WITH CORONARY ANGIOGRAM: SHX5449

## 2013-10-18 HISTORY — DX: Anxiety disorder, unspecified: F41.9

## 2013-10-18 HISTORY — DX: Depression, unspecified: F32.A

## 2013-10-18 HISTORY — DX: Essential (primary) hypertension: I10

## 2013-10-18 HISTORY — DX: Malignant melanoma of skin, unspecified: C43.9

## 2013-10-18 LAB — SEDIMENTATION RATE: Sed Rate: 37 mm/hr — ABNORMAL HIGH (ref 0–22)

## 2013-10-18 LAB — COMPREHENSIVE METABOLIC PANEL
ALT: 18 U/L (ref 0–35)
Albumin: 4 g/dL (ref 3.5–5.2)
Alkaline Phosphatase: 77 U/L (ref 39–117)
BUN: 13 mg/dL (ref 6–23)
Chloride: 103 mEq/L (ref 96–112)
Creatinine, Ser: 0.9 mg/dL (ref 0.50–1.10)
GFR calc Af Amer: 83 mL/min — ABNORMAL LOW (ref >90)
Glucose, Bld: 138 mg/dL — ABNORMAL HIGH (ref 70–99)
Potassium: 3.4 mEq/L — ABNORMAL LOW (ref 3.5–5.1)
Sodium: 141 mEq/L (ref 135–145)
Total Bilirubin: 0.6 mg/dL (ref 0.3–1.2)
Total Protein: 7.7 g/dL (ref 6.0–8.3)

## 2013-10-18 LAB — POCT I-STAT 3, ART BLOOD GAS (G3+)
Bicarbonate: 23.9 mEq/L (ref 20.0–24.0)
O2 Saturation: 95 %
TCO2: 25 mmol/L (ref 0–100)
pCO2 arterial: 34.6 mmHg — ABNORMAL LOW (ref 35.0–45.0)

## 2013-10-18 LAB — LIPID PANEL
HDL: 55 mg/dL (ref >39)
LDL Cholesterol: 155 mg/dL — ABNORMAL HIGH (ref 0–99)

## 2013-10-18 LAB — POCT I-STAT 3, VENOUS BLOOD GAS (G3P V)
Acid-Base Excess: 1 mmol/L (ref 0.0–2.0)
O2 Saturation: 64 %
TCO2: 26 mmol/L (ref 0–100)
pCO2, Ven: 37.5 mmHg — ABNORMAL LOW (ref 45.0–50.0)
pO2, Ven: 32 mmHg (ref 30.0–45.0)

## 2013-10-18 LAB — POCT I-STAT, CHEM 8
BUN: 15 mg/dL (ref 6–23)
Creatinine, Ser: 0.9 mg/dL (ref 0.50–1.10)
Glucose, Bld: 123 mg/dL — ABNORMAL HIGH (ref 70–99)
Hemoglobin: 15.6 g/dL — ABNORMAL HIGH (ref 12.0–15.0)
Sodium: 155 mEq/L — ABNORMAL HIGH (ref 135–145)
TCO2: 24 mmol/L (ref 0–100)

## 2013-10-18 LAB — TROPONIN I: Troponin I: 15.74 ng/mL (ref <0.30)

## 2013-10-18 LAB — MRSA PCR SCREENING: MRSA by PCR: NEGATIVE

## 2013-10-18 LAB — HEMOGLOBIN A1C: Hgb A1c MFr Bld: 5.4 % (ref <5.7)

## 2013-10-18 LAB — MAGNESIUM: Magnesium: 1.9 mg/dL (ref 1.5–2.5)

## 2013-10-18 SURGERY — LEFT AND RIGHT HEART CATHETERIZATION WITH CORONARY ANGIOGRAM
Anesthesia: LOCAL

## 2013-10-18 MED ORDER — LEVETIRACETAM 500 MG PO TABS
500.0000 mg | ORAL_TABLET | Freq: Two times a day (BID) | ORAL | Status: DC
  Administered 2013-10-18 – 2013-10-21 (×6): 500 mg via ORAL
  Filled 2013-10-18 (×7): qty 1

## 2013-10-18 MED ORDER — ONDANSETRON HCL 4 MG/2ML IJ SOLN: 4.0000 mg | Freq: Four times a day (QID) | INTRAMUSCULAR | Status: DC | PRN

## 2013-10-18 MED ORDER — NITROGLYCERIN IN D5W 200-5 MCG/ML-% IV SOLN
10.0000 ug/min | INTRAVENOUS | Status: DC
  Administered 2013-10-18: 5 ug/min via INTRAVENOUS
  Filled 2013-10-18: qty 250

## 2013-10-18 MED ORDER — NITROGLYCERIN 0.4 MG SL SUBL: 0.4000 mg | SUBLINGUAL_TABLET | SUBLINGUAL | Status: DC | PRN

## 2013-10-18 MED ORDER — ASPIRIN EC 81 MG PO TBEC
81.0000 mg | DELAYED_RELEASE_TABLET | Freq: Every day | ORAL | Status: DC
Start: 2013-10-18 — End: 2013-10-20
  Administered 2013-10-18 – 2013-10-20 (×3): 81 mg via ORAL
  Filled 2013-10-18 (×3): qty 1

## 2013-10-18 MED ORDER — FUROSEMIDE 10 MG/ML IJ SOLN
40.0000 mg | Freq: Two times a day (BID) | INTRAMUSCULAR | Status: AC
  Administered 2013-10-18 – 2013-10-19 (×2): 40 mg via INTRAVENOUS
  Filled 2013-10-18 (×2): qty 4

## 2013-10-18 MED ORDER — ACETAMINOPHEN 325 MG PO TABS: 650.0000 mg | ORAL_TABLET | ORAL | Status: DC | PRN

## 2013-10-18 MED ORDER — SODIUM CHLORIDE 0.9 % IV SOLN: 250.0000 mL | INTRAVENOUS | Status: DC | PRN

## 2013-10-18 MED ORDER — ASPIRIN EC 81 MG PO TBEC: 81.0000 mg | DELAYED_RELEASE_TABLET | Freq: Every day | ORAL | Status: DC

## 2013-10-18 MED ORDER — SODIUM CHLORIDE 0.9 % IJ SOLN: 3.0000 mL | INTRAMUSCULAR | Status: DC | PRN

## 2013-10-18 MED ORDER — ONDANSETRON HCL 4 MG/2ML IJ SOLN
4.0000 mg | Freq: Four times a day (QID) | INTRAMUSCULAR | Status: DC | PRN
  Administered 2013-10-19: 4 mg via INTRAVENOUS
  Filled 2013-10-18: qty 2

## 2013-10-18 MED ORDER — SODIUM CHLORIDE 0.9 % IJ SOLN
3.0000 mL | Freq: Two times a day (BID) | INTRAMUSCULAR | Status: DC
  Administered 2013-10-18: 3 mL via INTRAVENOUS

## 2013-10-18 MED ORDER — CARVEDILOL 3.125 MG PO TABS
3.1250 mg | ORAL_TABLET | Freq: Two times a day (BID) | ORAL | Status: DC
  Administered 2013-10-18: 3.125 mg via ORAL
  Filled 2013-10-18 (×4): qty 1

## 2013-10-18 MED ORDER — NITROGLYCERIN 0.2 MG/ML ON CALL CATH LAB
INTRAVENOUS | Status: AC
  Filled 2013-10-18: qty 1

## 2013-10-18 MED ORDER — FENTANYL CITRATE 0.05 MG/ML IJ SOLN
INTRAMUSCULAR | Status: AC
  Filled 2013-10-18: qty 2

## 2013-10-18 MED ORDER — SODIUM CHLORIDE 0.9 % IV SOLN
INTRAVENOUS | Status: AC
  Administered 2013-10-18: 20:00:00 via INTRAVENOUS

## 2013-10-18 MED ORDER — SIMVASTATIN 20 MG PO TABS
20.0000 mg | ORAL_TABLET | Freq: Every day | ORAL | Status: DC
  Administered 2013-10-18 – 2013-10-20 (×3): 20 mg via ORAL
  Filled 2013-10-18 (×4): qty 1

## 2013-10-18 MED ORDER — HEPARIN (PORCINE) IN NACL 100-0.45 UNIT/ML-% IJ SOLN
1150.0000 [IU]/h | INTRAMUSCULAR | Status: DC
  Filled 2013-10-18: qty 250

## 2013-10-18 MED ORDER — VALPROIC ACID 250 MG PO CAPS
500.0000 mg | ORAL_CAPSULE | Freq: Two times a day (BID) | ORAL | Status: DC
  Administered 2013-10-18: 500 mg via ORAL
  Filled 2013-10-18 (×2): qty 2

## 2013-10-18 MED ORDER — SODIUM CHLORIDE 0.9 % IJ SOLN: 3.0000 mL | Freq: Two times a day (BID) | INTRAMUSCULAR | Status: DC

## 2013-10-18 MED ORDER — POTASSIUM CHLORIDE CRYS ER 20 MEQ PO TBCR
40.0000 meq | EXTENDED_RELEASE_TABLET | Freq: Once | ORAL | Status: AC
  Administered 2013-10-18: 40 meq via ORAL
  Filled 2013-10-18: qty 2

## 2013-10-18 MED ORDER — POTASSIUM CHLORIDE CRYS ER 20 MEQ PO TBCR
20.0000 meq | EXTENDED_RELEASE_TABLET | Freq: Two times a day (BID) | ORAL | Status: AC
  Administered 2013-10-18 – 2013-10-19 (×2): 20 meq via ORAL
  Filled 2013-10-18 (×2): qty 1

## 2013-10-18 MED ORDER — FUROSEMIDE 10 MG/ML IJ SOLN
40.0000 mg | Freq: Once | INTRAMUSCULAR | Status: AC
  Administered 2013-10-18: 40 mg via INTRAVENOUS

## 2013-10-18 MED ORDER — ASPIRIN 81 MG PO CHEW: 81.0000 mg | CHEWABLE_TABLET | ORAL | Status: DC

## 2013-10-18 MED ORDER — METOPROLOL TARTRATE 12.5 MG HALF TABLET
12.5000 mg | ORAL_TABLET | Freq: Two times a day (BID) | ORAL | Status: DC
  Administered 2013-10-18: 12.5 mg via ORAL
  Filled 2013-10-18 (×2): qty 1

## 2013-10-18 MED ORDER — LISINOPRIL 2.5 MG PO TABS
2.5000 mg | ORAL_TABLET | Freq: Every day | ORAL | Status: DC
  Administered 2013-10-18: 2.5 mg via ORAL
  Filled 2013-10-18 (×2): qty 1

## 2013-10-18 MED ORDER — HYDROCODONE-ACETAMINOPHEN 5-325 MG PO TABS
1.0000 | ORAL_TABLET | Freq: Four times a day (QID) | ORAL | Status: DC | PRN
  Administered 2013-10-19 – 2013-10-21 (×6): 1 via ORAL
  Filled 2013-10-18 (×7): qty 1

## 2013-10-18 MED ORDER — METOPROLOL TARTRATE 25 MG PO TABS: 25.0000 mg | ORAL_TABLET | Freq: Two times a day (BID) | ORAL | Status: DC

## 2013-10-18 MED ORDER — HEPARIN (PORCINE) IN NACL 100-0.45 UNIT/ML-% IJ SOLN: 900.0000 [IU]/h | INTRAMUSCULAR | Status: DC

## 2013-10-18 MED ORDER — FUROSEMIDE 10 MG/ML IJ SOLN
INTRAMUSCULAR | Status: AC
  Filled 2013-10-18: qty 4

## 2013-10-18 MED ORDER — HEPARIN (PORCINE) IN NACL 2-0.9 UNIT/ML-% IJ SOLN
INTRAMUSCULAR | Status: AC
  Filled 2013-10-18: qty 1000

## 2013-10-18 MED ORDER — NITROGLYCERIN IN D5W 200-5 MCG/ML-% IV SOLN
10.0000 ug/min | INTRAVENOUS | Status: DC
  Administered 2013-10-18: 20 ug/min via INTRAVENOUS

## 2013-10-18 MED ORDER — ACETAMINOPHEN 325 MG PO TABS
650.0000 mg | ORAL_TABLET | ORAL | Status: DC | PRN
  Administered 2013-10-18 – 2013-10-20 (×3): 650 mg via ORAL
  Filled 2013-10-18 (×4): qty 2

## 2013-10-18 MED ORDER — LIDOCAINE HCL (PF) 1 % IJ SOLN
INTRAMUSCULAR | Status: AC
  Filled 2013-10-18: qty 30

## 2013-10-18 MED ORDER — SODIUM CHLORIDE 0.9 % IV SOLN: INTRAVENOUS | Status: DC

## 2013-10-18 MED ORDER — INFLUENZA VAC SPLIT QUAD 0.5 ML IM SUSP
0.5000 mL | INTRAMUSCULAR | Status: AC
  Administered 2013-10-19: 0.5 mL via INTRAMUSCULAR
  Filled 2013-10-18: qty 0.5

## 2013-10-18 NOTE — H&P (Signed)
Cardiology History and Physical  LUKING,SCOTT, MD  History of Present Illness (and review of medical records): Tara Dickson is a 52 y.o. female who presents for further evaluation as a transfer from [facility].  She initially presented there Wed am after a suspected seizure at home.  She was found by her fiancee disoriented.  She does not recall anything until being put in ambulance.  She states she did bite her tongue.  She has a hx of seizures, but has been off medications for 3 years now.  She was admitted with initial negative CT head.  Initial troponin was 0.16.  Serial troponins returned at 4.57 and 9.10.  She reports no chest pain leading up to event and none since being admitted. She was transferred for further evaluation and management.  She was given ASA and Heparin bolus prior to transfer.  Previous diagnostic testing for coronary artery disease includes: Negative dobutamine stress echo 12/2011. Previous history of cardiac disease includes None. Coronary artery disease risk factors include: dyslipidemia and obesity (BMI >= 30 kg/m2). Patient denies history of angina, cardiomyopathy, coronary artery disease, previous M.I. and valvular disease.  Previous Echo: 12/2011 Mild conc LVH. LV systolic function normal EF 65%, Mild MR, Mild AR, RV mildly dilated with normal function.  Review of Systems Pertinent items are noted in HPI. Further review of systems was otherwise negative other than stated in HPI.  Patient Active Problem List   Diagnosis Date Noted  . ANEMIA, NORMOCYTIC, CHRONIC 07/27/2010  . KNEE, ARTHRITIS, DEGEN./OSTEO 06/12/2008  . DERANGEMENT MENISCUS 06/12/2008  . KNEE PAIN 06/12/2008   Past Medical History  Diagnosis Date  . Hypertension   . Anxiety   . Depression   . Bipolar 1 disorder   . Melanoma     History reviewed. No pertinent past surgical history.  Prescriptions prior to admission  Medication Sig Dispense Refill  . metaxalone (SKELAXIN) 800 MG  tablet Take 1 tablet (800 mg total) by mouth 3 (three) times daily. As needed for muscle spasms  90 tablet  1  . pravastatin (PRAVACHOL) 40 MG tablet TAKE (1) TABLET BY MOUTH ONCE DAILY.  30 tablet  1   No Known Allergies  History  Substance Use Topics  . Smoking status: Former Smoker    Types: Cigarettes    Quit date: 10/18/1997  . Smokeless tobacco: Not on file  . Alcohol Use: No    History reviewed. No pertinent family history.   Objective:  Patient Vitals for the past 8 hrs:  BP Temp Temp src Pulse SpO2 Height Weight  10/18/13 0517 146/110 mmHg 98.9 F (37.2 C) Oral 109 91 % 5' 8.5" (1.74 m) 97.4 kg (214 lb 11.7 oz)   General appearance: alert, cooperative, appears stated age and mild distress Head: Normocephalic, without obvious abnormality, atraumatic Eyes: conjunctivae/corneas clear. PERRL, EOM's intact. Fundi benign. Neck: no carotid bruit, no JVD and supple, symmetrical, trachea midline Lungs: clear to auscultation bilaterally Chest wall: no tenderness Heart: regular rate and rhythm, S1, S2 normal, no murmur, click, rub or gallop Abdomen: soft, non-tender; bowel sounds normal; no masses,  no organomegaly Extremities: extremities normal, atraumatic, no cyanosis or edema Pulses: 2+ and symmetric Neurologic: Grossly normal  No results found for this or any previous visit (from the past 48 hour(s)). No results found.  ECG:  Sinus rhythm LAD, LVH, cannot rule of anterior infarct.  Previous ecg in 2013, sinus rhythm, LVH, normal axis, no evidence of prior infarct.  Assessment: Elevated troponin, unclear  etilogy, possible Type II MI, stress-induced CMP Suspect Seizure HTN HLD Depression/Anxiety  Plan:  1. Cardiology Admission to ICU 2. Continuous monitoring on Telemetry. 3. Repeat ekg on admit, prn chest pain or arrythmia 4. Trend cardiac biomarkers, check lipids, hgba1c, tsh 5. Medical management to include ASA, Heparin gtt, BB, Statin, NTG prn 6. TTE in am  assess LV function, wall motion abnormality 7. Further ischemic evaluation pending initial studies.

## 2013-10-18 NOTE — Progress Notes (Signed)
  Echocardiogram 2D Echocardiogram has been performed.  Tara Dickson 10/18/2013, 11:11 AM

## 2013-10-18 NOTE — Care Management Note (Signed)
    Page 1 of 1   10/18/2013     7:50:25 AM   CARE MANAGEMENT NOTE 10/18/2013  Patient:  Tara Dickson, Tara Dickson   Account Number:  [id number]  Date Initiated:  10/18/2013  Documentation initiated by:  Junius Creamer  Subjective/Objective Assessment:   adm w pos troponins and seizure     Action/Plan:   pcp dr Lilyan Punt, lives w fam   Anticipated DC Date:     Anticipated DC Plan:           Choice offered to / List presented to:             Status of service:   Medicare Important Message given?   (If response is "NO", the following Medicare IM given date fields will be blank) Date Medicare IM given:   Date Additional Medicare IM given:    Discharge Disposition:    Per UR Regulation:  Reviewed for med. necessity/level of care/duration of stay  If discussed at Long Length of Stay Meetings, dates discussed:    Comments:

## 2013-10-18 NOTE — Progress Notes (Signed)
Patient ID: LADAIJA DIMINO, female   DOB: 12/02/1961, 52 y.o.   MRN: [id number]       Patient Name: Tara Dickson Date of Encounter: 10/18/2013    SUBJECTIVE:Some what lethargic. Denies chest pain and dyspnea despite hypoxia by O2 sats. Prior negative ischemic w/u 1 year ago.  TELEMETRY:  Sinus tach Filed Vitals:   10/18/13 0517 10/18/13 0530 10/18/13 0545 10/18/13 0600  BP: 146/110 149/112 142/103 124/98  Pulse: 109 103 102 108  Temp: 98.9 F (37.2 C)     TempSrc: Oral     Resp:  21 23 18   Height: 5' 8.5" (1.74 m)     Weight: 214 lb 11.7 oz (97.4 kg)     SpO2: 91% 93% 95% 96%    Intake/Output Summary (Last 24 hours) at 10/18/13 0924 Last data filed at 10/18/13 0700  Gross per 24 hour  Intake      0 ml  Output    150 ml  Net   -150 ml    LABS: Basic Metabolic Panel:  Recent Labs  84/13/24 0718  NA 141  K 3.4*  CL 103  CO2 23  GLUCOSE 138*  BUN 13  CREATININE 0.90  CALCIUM 9.7  MG 1.9   CBC: No results found for this basename: WBC, NEUTROABS, HGB, HCT, MCV, PLT,  in the last 72 hours Cardiac Enzymes:  Recent Labs  10/18/13 0550  TROPONINI 15.74*   BNP    Component Value Date/Time   PROBNP 15394.0* 10/18/2013 0550   Hemoglobin A1C: No results found for this basename: HGBA1C,  in the last 72 hours Fasting Lipid Panel:  Recent Labs  10/18/13 0713  CHOL 230*  HDL 55  LDLCALC 155*  TRIG 101  CHOLHDL 4.2   ECG c/w anterior MI with Q waves V1-4, new c/w last year.  Radiology/Studies:  No acute abnormality  Physical Exam: Blood pressure 124/98, pulse 108, temperature 98.9 F (37.2 C), temperature source Oral, resp. rate 18, height 5' 8.5" (1.74 m), weight 214 lb 11.7 oz (97.4 kg), SpO2 96.00%. Weight change:   Rales diffuse right lung greater than left Summation gallop. No murmur No edema  ASSESSMENT:  1. Anterior Q wave MI late presenting now with CHF  2. Acute systolic heart failure 3. Recent seizure leading to admission  10/17/13 4. Hypertension with LVH by echo 1 year ago. 5. Bipolar disorder.  Plan:  1. IV lasix 2. Stat echo 3. IV NTG 4. IV heparin 5. When stable, I will cath. Not currently indicated due to late presentation and absence of symptoms  Signed, Lesleigh Noe 10/18/2013, 9:24 AM

## 2013-10-18 NOTE — H&P (View-Only) (Signed)
After reviewing data, especially the echo, I'm not sure this is CAD which would explain the absence of chest apin. Possible considerations include myocarditis presenting with a focal wall motion pattern and stress cardiomyopathy(the troponin is too high). CAD is still possible but seems less likely.

## 2013-10-18 NOTE — Progress Notes (Signed)
ANTICOAGULATION CONSULT NOTE - Initial Consult  Pharmacy Consult:  Heparin Indication:   ACS s/p cath  No Known Allergies  Patient Measurements: Height: 5' 8.5" (174 cm) Weight: 214 lb 11.7 oz (97.4 kg) IBW/kg (Calculated) : 65.05 Heparin Dosing Weight: 87 kg  Vital Signs: Temp: 98.8 F (37.1 C) (10/30 1600) Temp src: Oral (10/30 1600) BP: 127/96 mmHg (10/30 1700) Pulse Rate: 96 (10/30 1726)  Labs:  Recent Labs  10/18/13 0550 10/18/13 0718 10/18/13 1030 10/18/13 1756  HGB  --   --   --  15.6*  HCT  --   --   --  46.0  CREATININE  --  0.90  --  0.90  TROPONINI 15.74*  --  <0.30  --     Estimated Creatinine Clearance: 89 ml/min (by C-G formula based on Cr of 0.9).   Medical History: Past Medical History  Diagnosis Date  . Hypertension   . Anxiety   . Depression   . Bipolar 1 disorder   . Melanoma         Assessment: 80 YOF presented to [facility] on 10/17/13 with suspected seizure, then transferred to [facility] on 10/18/13 for further cardiac management.  Prior to transfer patient was started in IV heparin at 1160 units/hr and her heparin level was therapeutic at 0.58 units/mL.  She remained on IV heparin until before her cath.  She is now s/p cath and IV heparin to restart 8 hours post sheath removal (removed around 1830 per RN).  No bleeding reported.   Goal of Therapy:  Heparin level 0.3-0.7 units/ml Monitor platelets by anticoagulation protocol: Yes    Plan:  - At 0230 on 10/19/13, start IV heparin at 1150 units/hr - Check 6 hr HL post gtt started - Daily HL / CBC - F/U clinical progress    Searcy Miyoshi D. Laney Potash, PharmD, BCPS Pager:  [phone] 10/18/2013, 7:27 PM

## 2013-10-18 NOTE — Progress Notes (Signed)
Lab called to report elevated troponin of 15.94.  Dr. Katrinka Blazing is at the bedside and was notified.

## 2013-10-18 NOTE — Progress Notes (Signed)
After reviewing data, especially the echo, I'm not sure this is CAD which would explain the absence of chest apin. Possible considerations include myocarditis presenting with a focal wall motion pattern and stress cardiomyopathy(the troponin is too high). CAD is still possible but seems less likely. 

## 2013-10-18 NOTE — Consult Note (Signed)
NEURO HOSPITALIST CONSULT NOTE    Reason for Consult: Seizures  HPI:                                                                                                                                          Tara Dickson is an 52 y.o. female who states she was diagnosed with seizures back in 2007.  At that time she describes even very similar to what happened today.  She became tense and had AMS.  She states she was seen in [facility] and then followed up as a out patient with her primary care MD.  She was placed on Depakote 500 mg BID at that time. She had been on this medication for about 5 years and remained seizure free. Over the last two years she was taken on ff the Depakote due to remaining seizure free. Yesterday morning her fiance noted she was "tense and then fell out of bed, she would not respond, he called EMS, this lasted for 5 minutes.  By the time EMS arrived she was more alert (in about 15 minutes)"  Over her hospital stay her mental status has now returned to baseline.   She was normal vaginal birth, no birth complications, no seizure history in family, no febrile seizures.   Currently she has been placed back on Depakote 500 mg BID and received one dose.   Past Medical History  Diagnosis Date  . Hypertension   . Anxiety   . Depression   . Bipolar 1 disorder   . Melanoma     History reviewed. No pertinent past surgical history.  Family History  Problem Relation Age of Onset  . Hypertension Mother   . Hypertension Father     Social History:  reports that she quit smoking about 16 years ago. Her smoking use included Cigarettes. She smoked 0.00 packs per day. She does not have any smokeless tobacco history on file. She reports that she does not drink alcohol or use illicit drugs.  No Known Allergies  MEDICATIONS:                                                                                                                     Prior to  Admission:  Prescriptions prior to admission  Medication  Sig Dispense Refill  . ALPRAZolam (XANAX) 0.5 MG tablet Take 0.5 mg by mouth 3 (three) times daily as needed for sleep.      Marland Kitchen lidocaine (LIDODERM) 5 % Place 1 patch onto the skin daily. Remove & Discard patch within 12 hours or as directed by MD      . omeprazole (PRILOSEC) 20 MG capsule Take 20 mg by mouth daily.      . pravastatin (PRAVACHOL) 40 MG tablet TAKE (1) TABLET BY MOUTH ONCE DAILY.  30 tablet  1  . sertraline (ZOLOFT) 50 MG tablet Take 75 mg by mouth daily.       Scheduled: . aspirin EC  81 mg Oral Daily  . carvedilol  3.125 mg Oral BID WC  . furosemide      . furosemide  40 mg Intravenous Q12H  . [START ON 10/19/2013] influenza vac split quadrivalent PF  0.5 mL Intramuscular Tomorrow-1000  . lisinopril  2.5 mg Oral Daily  . potassium chloride  20 mEq Oral BID  . simvastatin  20 mg Oral q1800  . sodium chloride  3 mL Intravenous Q12H  . valproic acid  500 mg Oral BID     ROS:                                                                                                                                       History obtained from the patient  General ROS: negative for - chills, fatigue, fever, night sweats, weight gain or weight loss Psychological ROS: negative for - behavioral disorder, hallucinations, memory difficulties, mood swings or suicidal ideation Ophthalmic ROS: negative for - blurry vision, double vision, eye pain or loss of vision ENT ROS: negative for - epistaxis, nasal discharge, oral lesions, sore throat, tinnitus or vertigo Allergy and Immunology ROS: negative for - hives or itchy/watery eyes Hematological and Lymphatic ROS: negative for - bleeding problems, bruising or swollen lymph nodes Endocrine ROS: negative for - galactorrhea, hair pattern changes, polydipsia/polyuria or temperature intolerance Respiratory ROS: negative for - cough, hemoptysis, shortness of breath or wheezing Cardiovascular  ROS: negative for - chest pain, dyspnea on exertion, edema or irregular heartbeat Gastrointestinal ROS: negative for - abdominal pain, diarrhea, hematemesis, nausea/vomiting or stool incontinence Genito-Urinary ROS: negative for - dysuria, hematuria, incontinence or urinary frequency/urgency Musculoskeletal ROS: negative for - joint swelling or muscular weakness Neurological ROS: as noted in HPI Dermatological ROS: negative for rash and skin lesion changes   Blood pressure 141/100, pulse 97, temperature 99 F (37.2 C), temperature source Oral, resp. rate 23, height 5' 8.5" (1.74 m), weight 97.4 kg (214 lb 11.7 oz), SpO2 96.00%.   Neurologic Examination:  Mental Status: Alert, oriented, thought content appropriate.  Speech fluent without evidence of aphasia.  Able to follow 3 step commands without difficulty. Cranial Nerves: II: Discs flat bilaterally; Visual fields grossly normal, pupils aniscoric, round, reactive to light and accommodation III,IV, VI: ptosis not present, extra-ocular motions intact bilaterally V,VII: smile symmetric, facial light touch sensation normal bilaterally VIII: hearing normal bilaterally IX,X: gag reflex present XI: bilateral shoulder shrug XII: midline tongue extension without atrophy or fasciculations  Motor: Right : Upper extremity   5/5    Left:     Upper extremity   5/5  Lower extremity   5/5     Lower extremity   5/5 Tone and bulk:normal tone throughout; no atrophy noted Sensory: Pinprick and light touch intact throughout, bilaterally Deep Tendon Reflexes:  Right: Upper Extremity   Left: Upper extremity   biceps (C-5 to C-6) 2/4   biceps (C-5 to C-6) 2/4 tricep (C7) 2/4    triceps (C7) 2/4 Brachioradialis (C6) 2/4  Brachioradialis (C6) 2/4  Lower Extremity Lower Extremity  quadriceps (L-2 to L-4) 2/4   quadriceps (L-2 to L-4) 2/4 Achilles (S1)  2/4   Achilles (S1) 2/4  Plantars: Right: downgoing   Left: downgoing Cerebellar: normal finger-to-nose,  normal heel-to-shin test CV: pulses palpable throughout    Lab Results  Component Value Date/Time   CHOL 230* 10/18/2013  7:13 AM    Results for orders placed during the hospital encounter of 10/18/13 (from the past 48 hour(s))  MRSA PCR SCREENING     Status: None   Collection Time    10/18/13  5:17 AM      Result Value Range   MRSA by PCR NEGATIVE  NEGATIVE   Comment:            The GeneXpert MRSA Assay (FDA     approved for NASAL specimens     only), is one component of a     comprehensive MRSA colonization     surveillance program. It is not     intended to diagnose MRSA     infection nor to guide or     monitor treatment for     MRSA infections.  TROPONIN I     Status: Abnormal   Collection Time    10/18/13  5:50 AM      Result Value Range   Troponin I 15.74 (*) <0.30 ng/mL   Comment:            Due to the release kinetics of cTnI,     a negative result within the first hours     of the onset of symptoms does not rule out     myocardial infarction with certainty.     If myocardial infarction is still suspected,     repeat the test at appropriate intervals.     CRITICAL RESULT CALLED TO, READ BACK BY AND VERIFIED WITH:     PADGETT H RN 10/18/13 0913 COSTELLO B     REPEATED TO VERIFY  PRO B NATRIURETIC PEPTIDE     Status: Abnormal   Collection Time    10/18/13  5:50 AM      Result Value Range   Pro B Natriuretic peptide (BNP) 15394.0 (*) 0 - 125 pg/mL  LIPID PANEL     Status: Abnormal   Collection Time    10/18/13  7:13 AM      Result Value Range   Cholesterol 230 (*) 0 - 200 mg/dL   Triglycerides 130  <  150 mg/dL   HDL 55  >96 mg/dL   Total CHOL/HDL Ratio 4.2     VLDL 20  0 - 40 mg/dL   LDL Cholesterol 045 (*) 0 - 99 mg/dL   Comment:            Total Cholesterol/HDL:CHD Risk     Coronary Heart Disease Risk Table                         Men   Women       1/2 Average Risk   3.4   3.3      Average Risk       5.0   4.4      2 X Average Risk   9.6   7.1      3 X Average Risk  23.4   11.0                Use the calculated Patient Ratio     above and the CHD Risk Table     to determine the patient's CHD Risk.                ATP III CLASSIFICATION (LDL):      <100     mg/dL   Optimal      409-811  mg/dL   Near or Above                        Optimal      130-159  mg/dL   Borderline      914-782  mg/dL   High      >956     mg/dL   Very High  COMPREHENSIVE METABOLIC PANEL     Status: Abnormal   Collection Time    10/18/13  7:18 AM      Result Value Range   Sodium 141  135 - 145 mEq/L   Potassium 3.4 (*) 3.5 - 5.1 mEq/L   Chloride 103  96 - 112 mEq/L   CO2 23  19 - 32 mEq/L   Glucose, Bld 138 (*) 70 - 99 mg/dL   BUN 13  6 - 23 mg/dL   Creatinine, Ser 2.13  0.50 - 1.10 mg/dL   Calcium 9.7  8.4 - 08.6 mg/dL   Total Protein 7.7  6.0 - 8.3 g/dL   Albumin 4.0  3.5 - 5.2 g/dL   AST 65 (*) 0 - 37 U/L   ALT 18  0 - 35 U/L   Alkaline Phosphatase 77  39 - 117 U/L   Total Bilirubin 0.6  0.3 - 1.2 mg/dL   GFR calc non Af Amer 72 (*) >90 mL/min   GFR calc Af Amer 83 (*) >90 mL/min   Comment: (NOTE)     The eGFR has been calculated using the CKD EPI equation.     This calculation has not been validated in all clinical situations.     eGFR's persistently <90 mL/min signify possible Chronic Kidney     Disease.  MAGNESIUM     Status: None   Collection Time    10/18/13  7:18 AM      Result Value Range   Magnesium 1.9  1.5 - 2.5 mg/dL  TROPONIN I     Status: None   Collection Time    10/18/13 10:30 AM      Result Value Range   Troponin I <0.30  <0.30 ng/mL  Comment:            Due to the release kinetics of cTnI,     a negative result within the first hours     of the onset of symptoms does not rule out     myocardial infarction with certainty.     If myocardial infarction is still suspected,     repeat the test at appropriate  intervals.    No results found.   Felicie Morn PA-C [facility] [phone]  10/18/2013, 2:52 PM  Assessment/Plan:  52 year old female with tensing that lasted approximately 5 minutes followed by 15 minutes of decreased responsiveness.  She had 2 seizures previously, but given that that was her only episode of seizures her Depakote was stopped. The description of this event is concerning for recurrent seizure activity. Given her history, I would favor restarting medication. I do not think this explains her cardiac issues.  Of note she had issues with weight gain while on Depakote. I think would therefore be reasonable to use a different agent at this time.  1) EEG  2) Keppra 500 mg twice a day   Ritta Slot, MD [facility] [phone]  If 7pm- 7am, please page neurology on call at [phone].

## 2013-10-18 NOTE — Interval H&P Note (Signed)
Cath Lab Visit (complete for each Cath Lab visit)  Clinical Evaluation Leading to the Procedure:   ACS: yes  Non-ACS:    Anginal Classification: CCS III  Anti-ischemic medical therapy: Maximal Therapy (2 or more classes of medications)  Non-Invasive Test Results: No non-invasive testing performed  Prior CABG: No previous CABG      History and Physical Interval Note:  10/18/2013 5:28 PM   Tara Dickson  has presented today for surgery, with the diagnosis of Chest pain  The various methods of treatment have been discussed with the patient and family. After consideration of risks, benefits and other options for treatment, the patient has consented to  Procedure(s): LEFT AND RIGHT HEART CATHETERIZATION WITH CORONARY ANGIOGRAM (N/A) as a surgical intervention .  The patient's history has been reviewed, patient examined, no change in status, stable for surgery.  I have reviewed the patient's chart and labs.  Questions were answered to the patient's satisfaction.    We decided to perform coronary angiography today under urgent circumstances because she began experiencing moderate left chest pressure for the first time since admission. In setting of elevated cardiac markers we need to exclude the possibility of high-grade coronary disease even though the wall motion would make coronary disease unlikely. She continues to have hypoxia.   Tara Dickson

## 2013-10-18 NOTE — CV Procedure (Signed)
     Left and Right Heart Catheterization with Coronary Angiography  Report  Tara Dickson  52 y.o.  female 1961/02/06  Procedure Date: 10/18/2013 Referring Physician: [facility] Primary Cardiologist:: H. Therese Sarah Leia Alf, M.D.  INDICATIONS: Acute coronary syndrome with elevated cardiac markers and chest pain  PROCEDURE: 1. Left heart catheterization; 2. Right heart catheterization; 3. Coronary angiography; 4. Left ventriculography  CONSENT:  The risks, benefits, and details of the procedure were explained in detail to the patient. Risks including death, stroke, heart attack, kidney injury, allergy, limb ischemia, bleeding and radiation injury were discussed.  The patient verbalized understanding and wanted to proceed.  Informed written consent was obtained.  PROCEDURE TECHNIQUE:  After Xylocaine anesthesia a 5 French arterial sheath was placed in the right femoral artery with a single anterior needle wall stick. A 7 French sheath was placed in the right femoral vein. Right heart catheterization was performed with a 7 French Swan-Ganz catheter. Coronary angiography was done using a 4 cm 5 French left Judkins and 4 cm 5 French right Judkins catheter.  Left ventriculography was done using the 5 French angled pigtail catheter and power injection.   No difficulties were encountered during the procedure. Oximetry samples were obtained from the main PA and the descending aorta.  MEDICATIONS: And 50 mcg of fentanyl   EQUIPMENT: Already noted above  CONTRAST:  Total of 80 cc.  COMPLICATIONS:  None   HEMODYNAMICS:  Aortic pressure 113/82; LV pressure 119/17; LVEDP 27, RA 4, RV 35/6, PA 25/8, PCWP(mean) 16, Cardiac Output 4.25 L per minute, AV gradient none  ANGIOGRAPHIC DATA:   The left main coronary artery is normal.  The left anterior descending artery is widely patent but not transapical..  The left circumflex artery is large and supplies the left ventricular apex. Widely  patent..  The right coronary artery is dominant and widely patent.Marland Kitchen   LEFT VENTRICULOGRAM:  Left ventricular angiogram was done in the 30 RAO projection and revealed anterior wall severe hypokinesis with apical sparing, mid inferior wall severe hypokinesis, and hypercontractility of the apex and inferobasal segment. The estimated ejection fraction is 30%. No significant might regurgitation is noted   IMPRESSIONS:  1. Widely patent coronary arteries.  2. Marked left ventricular systolic dysfunction with an unusual regional wall motion pattern with inferobasal and apical hyperkinesis. Anterobasal to distal anterior wall is akinetic and the mid inferior wall is akinetic. These findings likely represent a stress cardiomyopathy. Myocarditis cannot be excluded but seems less likely.   RECOMMENDATION:  Supportive including beta blocker therapy, diuretics as needed, ACE inhibitor therapy, blood pressure control, and anticoagulation while immobilized. We'll consider heart 3 team consult depending upon her course over the next 12-24 hours.Marland Kitchen

## 2013-10-19 ENCOUNTER — Inpatient Hospital Stay (HOSPITAL_COMMUNITY): Payer: Medicare Other

## 2013-10-19 DIAGNOSIS — E870 Hyperosmolality and hypernatremia: Secondary | ICD-10-CM

## 2013-10-19 DIAGNOSIS — I5021 Acute systolic (congestive) heart failure: Principal | ICD-10-CM

## 2013-10-19 DIAGNOSIS — I214 Non-ST elevation (NSTEMI) myocardial infarction: Secondary | ICD-10-CM

## 2013-10-19 DIAGNOSIS — J690 Pneumonitis due to inhalation of food and vomit: Secondary | ICD-10-CM

## 2013-10-19 LAB — CBC
HCT: 40.6 % (ref 36.0–46.0)
Hemoglobin: 14.5 g/dL (ref 12.0–15.0)
MCV: 87.6 fL (ref 78.0–100.0)
Platelets: 189 10*3/uL (ref 150–400)
RBC: 4.63 MIL/uL (ref 3.87–5.11)
RBC: 4.34 MIL/uL (ref 3.87–5.11)
RDW: 14.3 % (ref 11.5–15.5)
RDW: 14.1 % (ref 11.5–15.5)
WBC: 19.4 10*3/uL — ABNORMAL HIGH (ref 4.0–10.5)
WBC: 21.1 10*3/uL — ABNORMAL HIGH (ref 4.0–10.5)

## 2013-10-19 MED ORDER — PIPERACILLIN-TAZOBACTAM 3.375 G IVPB
3.3750 g | Freq: Three times a day (TID) | INTRAVENOUS | Status: DC
  Administered 2013-10-19 – 2013-10-20 (×3): 3.375 g via INTRAVENOUS
  Filled 2013-10-19 (×5): qty 50

## 2013-10-19 MED ORDER — LISINOPRIL 10 MG PO TABS
10.0000 mg | ORAL_TABLET | Freq: Every day | ORAL | Status: DC
  Administered 2013-10-19 – 2013-10-20 (×2): 10 mg via ORAL
  Filled 2013-10-19 (×4): qty 1

## 2013-10-19 MED ORDER — SODIUM CHLORIDE 0.45 % IV SOLN
INTRAVENOUS | Status: AC
  Administered 2013-10-19: 09:00:00 via INTRAVENOUS

## 2013-10-19 MED ORDER — SPIRONOLACTONE 25 MG PO TABS
25.0000 mg | ORAL_TABLET | Freq: Two times a day (BID) | ORAL | Status: DC
  Filled 2013-10-19 (×3): qty 1

## 2013-10-19 MED ORDER — CARVEDILOL 6.25 MG PO TABS
6.2500 mg | ORAL_TABLET | Freq: Two times a day (BID) | ORAL | Status: DC
  Administered 2013-10-19 – 2013-10-20 (×4): 6.25 mg via ORAL
  Filled 2013-10-19 (×7): qty 1

## 2013-10-19 MED ORDER — SODIUM CHLORIDE 0.9 % IV SOLN: INTRAVENOUS | Status: DC

## 2013-10-19 MED ORDER — PANTOPRAZOLE SODIUM 40 MG PO TBEC
40.0000 mg | DELAYED_RELEASE_TABLET | Freq: Every day | ORAL | Status: DC
  Administered 2013-10-19 – 2013-10-21 (×3): 40 mg via ORAL
  Filled 2013-10-19 (×3): qty 1

## 2013-10-19 MED ORDER — ENOXAPARIN SODIUM 100 MG/ML ~~LOC~~ SOLN
100.0000 mg | Freq: Two times a day (BID) | SUBCUTANEOUS | Status: DC
  Administered 2013-10-19 – 2013-10-20 (×3): 100 mg via SUBCUTANEOUS
  Filled 2013-10-19 (×4): qty 1

## 2013-10-19 MED ORDER — ALUM & MAG HYDROXIDE-SIMETH 200-200-20 MG/5ML PO SUSP
30.0000 mL | ORAL | Status: DC | PRN
  Administered 2013-10-19 – 2013-10-20 (×2): 30 mL via ORAL
  Filled 2013-10-19 (×2): qty 30

## 2013-10-19 NOTE — Progress Notes (Signed)
ANTICOAGULATION CONSULT NOTE   Pharmacy Consult:  Heparin>>lovenox Indication:   ACS s/p cath  No Known Allergies  Patient Measurements: Height: 5' 8.5" (174 cm) Weight: 214 lb 11.7 oz (97.4 kg) IBW/kg (Calculated) : 65.05 Heparin Dosing Weight: 87 kg  Vital Signs: Temp: 99.4 F (37.4 C) (10/31 0743) Temp src: Oral (10/31 0743) BP: 127/93 mmHg (10/31 0800) Pulse Rate: 105 (10/31 0800)  Labs:  Recent Labs  10/18/13 0550 10/18/13 0718 10/18/13 1030 10/18/13 1756 10/18/13 1818 10/19/13 0535  HGB  --   --   --  15.6*  --  14.5  HCT  --   --   --  46.0  --  40.6  PLT  --   --   --   --   --  194  CREATININE  --  0.90  --  0.90  --   --   TROPONINI 15.74*  --  <0.30  --  19.66*  --     Estimated Creatinine Clearance: 89 ml/min (by C-G formula based on Cr of 0.9).  Assessment: 57 YOF presented to [facility] on 10/17/13 with suspected seizure, then transferred to [facility] on 10/18/13 for further cardiac management.  Prior to transfer patient was started in IV heparin at 1160 units/hr and her heparin level was therapeutic at 0.58 units/mL.  She remained on IV heparin until before her cath.  She was then restarted on heparin post cath. Orders received this morning to transition to sq lovenox for continued treatment of stress cardiomyopathy. CBC is stable and renal function is excellent for bid lovenox dosing.  Possible LLL infiltrate will start on empiric zosyn therapy. WBC elevated at 19.  Goal of Therapy:  4h Anti-Xa level - 0.6-1.2 Eradication of infection Monitor platelets by anticoagulation protocol: Yes    Plan:  Stop heparin and levels this am Lovenox 100mg  sq q12 hours CBC q 72h while on therapy Zosyn 3.375g IV q8 - infuse over 4h Follow fever curve, C&S, renal function  Sheppard Coil PharmD., BCPS Clinical Pharmacist Pager [phone] 10/19/2013 9:11 AM

## 2013-10-19 NOTE — Progress Notes (Signed)
Portable EEG completed

## 2013-10-19 NOTE — Progress Notes (Signed)
       Patient Name: Tara Dickson Date of Encounter: 10/19/2013    SUBJECTIVE: Wants to go home. Has not had recurrence of chest pain that she experienced yesterday that prompted cath. She denies dyspnea. He back is hurting from laying.  TELEMETRY:  NSR and ST Filed Vitals:   10/19/13 0500 10/19/13 0600 10/19/13 0700 10/19/13 0743  BP: 101/85  131/90   Pulse: 103 103 90 99  Temp:    99.4 F (37.4 C)  TempSrc:    Oral  Resp: 14 16 33 19  Height:      Weight:      SpO2: 99% 99% 95% 93%    Intake/Output Summary (Last 24 hours) at 10/19/13 0747 Last data filed at 10/19/13 0600  Gross per 24 hour  Intake 1280.21 ml  Output   1675 ml  Net -394.79 ml    LABS: Basic Metabolic Panel:  Recent Labs  81/19/14 0718 10/18/13 1756  NA 141 155*  K 3.4* 3.6  CL 103 103  CO2 23  --   GLUCOSE 138* 123*  BUN 13 15  CREATININE 0.90 0.90  CALCIUM 9.7  --   MG 1.9  --    CBC:  Recent Labs  10/18/13 1756 10/19/13 0535  WBC  --  19.4*  HGB 15.6* 14.5  HCT 46.0 40.6  MCV  --  87.7  PLT  --  194   Cardiac Enzymes:  Recent Labs  10/18/13 0550 10/18/13 1030 10/18/13 1818  TROPONINI 15.74* <0.30 19.66*   BNP (last 3 results)  Recent Labs  10/18/13 0550  PROBNP 15394.0*   Hemoglobin A1C:  Recent Labs  10/18/13 0718  HGBA1C 5.4   Fasting Lipid Panel:  Recent Labs  10/18/13 0713  CHOL 230*  HDL 55  LDLCALC 155*  TRIG 101  CHOLHDL 4.2   Erythrocyte Sedimentation Rate     Component Value Date/Time   ESRSEDRATE 37* 10/18/2013 1459    Radiology/Studies:  Portable chest suggests a LLL infiltrate.  Physical Exam: Blood pressure 131/90, pulse 99, temperature 99.4 F (37.4 C), temperature source Oral, resp. rate 19, height 5' 8.5" (1.74 m), weight 214 lb 11.7 oz (97.4 kg), SpO2 93.00%. Weight change:   No gallop or rub Diffuse basilar rales. No edema Lethargic  ASSESSMENT:  1. Acute systolic heart failure presumed related to STRESS  cardiomyopathy. May currently be volume depleted. 2. Hypertension, previously poorly controlled. 3. Hypoxemic respiratory failure due to CHF, but white count raises question of infection as does LLL infiltrate. Remains afebrile. 4. Syncope/seizure that led to admission. This is poorly characterized and neuro has been consulted. 5. Hypernatremia, from diuresis  Plan:  1. Supportive care 2. Titrate heat failure therapy 3. Culture blood and urine. Sputum if any produced. Zosyn for possible aspiration PNA developing after seizure 4. Will lift fluid restriction  Selinda Eon 10/19/2013, 7:47 AM

## 2013-10-19 NOTE — Progress Notes (Signed)
Subjective: No acute events overnight.   Exam: Filed Vitals:   10/19/13 0800  BP: 127/93  Pulse: 105  Temp:   Resp: 13   Gen: In bed, NAD MS: Awake, Alert CN: pupils slightly assymetric(old) but reactive bilaterally, eomi Motor: 5/5 throughout Sensory:intact to lt   Impression: 52 yo F with episode of tensing, LOC, with subsequent confusion. She has a history fo two previous seizures, and was on depakote for several years, but has now been seizure free off of medications for a couple of years. It is difficult to state with certainty that this was a seizure, but with her history I feel that AED therapy, at least in the short term, is warrented.   If seizure, it may be that a physiological stressor causing her heart issues lowered her seizure threshold in someone predisposed to seizure. Especially without clear GTC type movements, I do nto think seizure alone could cause a troponin leak this high.   Recommendations: 1) keppra 500mg  IBD 2) EEG, if positive, then would favor life long therapy. If negative would continue keppra for now but may consider coming off again.  3) Patient is from [place], could follow up with neurology closer to her home.  4) Will follow up EEG, but no further recommendations following this other than continuing keppra until follow up.   Ritta Slot, MD [facility] [phone]  If 7pm- 7am, please page neurology on call at [phone].

## 2013-10-19 NOTE — Procedures (Signed)
History: 52 year old female with likely seizure   Background: There maximal wakefulness, the EEG consists of intermixed alpha and beta activities. There is an increase in delta with drowsiness and normal sleep structures are observed There is a well defined posterior dominant rhythm of 8.5 Hz that attenuates with eye opening. During sleep, occasional sharp waves are seen at T8 > F8,P8.  Photic stimulation: Physiologic driving is not perform  EEG Abnormalities: 1) right temporal sharp waves  Clinical Interpretation: This EEG is consistent with an area of potential epileptogenicity in the right temporal region. There was no seizure recorded on this study.   Ritta Slot, MD [facility] [phone]  If 7pm- 7am, please page neurology on call at [phone].

## 2013-10-20 ENCOUNTER — Inpatient Hospital Stay (HOSPITAL_COMMUNITY): Payer: Medicare Other

## 2013-10-20 DIAGNOSIS — E669 Obesity, unspecified: Secondary | ICD-10-CM

## 2013-10-20 DIAGNOSIS — I5181 Takotsubo syndrome: Secondary | ICD-10-CM

## 2013-10-20 DIAGNOSIS — G40909 Epilepsy, unspecified, not intractable, without status epilepticus: Secondary | ICD-10-CM

## 2013-10-20 LAB — BASIC METABOLIC PANEL
BUN: 24 mg/dL — ABNORMAL HIGH (ref 6–23)
GFR calc non Af Amer: 66 mL/min — ABNORMAL LOW (ref >90)
Glucose, Bld: 95 mg/dL (ref 70–99)
Potassium: 3.3 mEq/L — ABNORMAL LOW (ref 3.5–5.1)
Sodium: 144 mEq/L (ref 135–145)

## 2013-10-20 LAB — CBC
HCT: 36.7 % (ref 36.0–46.0)
Hemoglobin: 12.4 g/dL (ref 12.0–15.0)
MCH: 30.4 pg (ref 26.0–34.0)
MCHC: 33.8 g/dL (ref 30.0–36.0)
MCV: 90 fL (ref 78.0–100.0)
WBC: 15.9 10*3/uL — ABNORMAL HIGH (ref 4.0–10.5)

## 2013-10-20 MED ORDER — FUROSEMIDE 40 MG PO TABS
40.0000 mg | ORAL_TABLET | Freq: Every day | ORAL | Status: DC
  Administered 2013-10-20 – 2013-10-21 (×2): 40 mg via ORAL
  Filled 2013-10-20 (×2): qty 1

## 2013-10-20 MED ORDER — ALPRAZOLAM 0.5 MG PO TABS
0.5000 mg | ORAL_TABLET | Freq: Three times a day (TID) | ORAL | Status: DC | PRN
  Administered 2013-10-20 (×2): 0.5 mg via ORAL
  Filled 2013-10-20 (×2): qty 1

## 2013-10-20 MED ORDER — POTASSIUM CHLORIDE CRYS ER 20 MEQ PO TBCR
40.0000 meq | EXTENDED_RELEASE_TABLET | Freq: Four times a day (QID) | ORAL | Status: AC
  Administered 2013-10-20 (×2): 40 meq via ORAL
  Filled 2013-10-20 (×2): qty 2

## 2013-10-20 MED ORDER — POTASSIUM CHLORIDE 20 MEQ/15ML (10%) PO LIQD
40.0000 meq | Freq: Two times a day (BID) | ORAL | Status: DC
  Filled 2013-10-20 (×2): qty 30

## 2013-10-20 MED ORDER — ENOXAPARIN SODIUM 40 MG/0.4ML ~~LOC~~ SOLN
40.0000 mg | SUBCUTANEOUS | Status: DC
  Administered 2013-10-20: 40 mg via SUBCUTANEOUS
  Filled 2013-10-20 (×2): qty 0.4

## 2013-10-20 NOTE — Progress Notes (Addendum)
Cardiac Rehab [phone] Completed MI and CHF education with pt and family. She voices understanding. Pt agrees to Outpt. CRP in [place], will send referral. Beatrix Fetters 3:04 PM 10/20/2013

## 2013-10-20 NOTE — Progress Notes (Addendum)
Subjective:  No complaints of shortness of breath or chest pain today. Says she wants to go home. No further seizures. Neurology note read.  Objective:  Vital Signs in the last 24 hours: BP 119/85  Pulse 81  Temp(Src) 99.2 F (37.3 C) (Oral)  Resp 12  Ht 5' 8.5" (1.74 m)  Wt 97.4 kg (214 lb 11.7 oz)  BMI 32.17 kg/m2  SpO2 97%  Physical Exam: Obese white female who is lethargic and in no acute distress. She is oriented Lungs:  Clear  Cardiac:  Regular rhythm, normal S1 and S2, no S3 Extremities:  No edema present  Intake/Output from previous day: 10/31 0701 - 11/01 0700 In: 591.5 [I.V.:491.5; IV Piggyback:100] Out: 900 [Urine:900] Weight Filed Weights   10/18/13 0517  Weight: 97.4 kg (214 lb 11.7 oz)    Lab Results: Basic Metabolic Panel:  Recent Labs  95/62/13 0718 10/18/13 1756 10/20/13 0352  NA 141 155* 144  K 3.4* 3.6 3.3*  CL 103 103 102  CO2 23  --  30  GLUCOSE 138* 123* 95  BUN 13 15 24*  CREATININE 0.90 0.90 0.96    CBC:  Recent Labs  10/19/13 0845 10/20/13 0352  WBC 21.1* 15.9*  HGB 13.1 12.4  HCT 38.0 36.7  MCV 87.6 90.0  PLT 189 152    BNP    Component Value Date/Time   PROBNP 15394.0* 10/18/2013 0550   Telemetry: Sinus rhythm with occasional PVCs  Assessment/Plan: 1. Takasubo cardiomyopathy 2. Acute systolic heart failure due to #1 3. Seizure disorder 4. Obesity 5. Treatment for suspected aspiration pneumonia but not much in the way of fever  Recommendations:  Move to telemetry and ambulate. Titrate heart failure therapy. If stable consider discharge in morning. Replete potassium. Stop antibiotics and repeat chest x-ray and watch fever.      Darden Palmer  MD [facility]  10/20/2013, 9:40 AM

## 2013-10-20 NOTE — Progress Notes (Signed)
Pt c/o CP at this time, VSS, EKG completed at the bedside NSR, no distress noticed. Family member at the bedside. Tylenol PO given with some relieve followed by 1 tab Vicodin, Xanax as ordered PRN and Maalox with relieve. We'll continue to monitor.

## 2013-10-20 NOTE — Progress Notes (Signed)
Subjective: No acute events overnight.   Exam: Filed Vitals:   10/20/13 0700  BP: 104/72  Pulse: 73  Temp:   Resp: 22   Gen: In bed, NAD MS: Sleepy but arousable.  CN: pupils slightly assymetric(old) but reactive bilaterally, eomi Motor: 5/5 throughout Sensory:intact to lt   Impression: 52 yo F with episode of tensing, LOC, with subsequent confusion. She has a history of two previous seizures, and was on depakote for several years, but has now been seizure free off of medications for a couple of years. With abnormal EEG, I would not recommend stopping AED therapy.   Recommendations: 1) keppra 500mg  BID, please provide patient with prescription at discharge, she wishes to follow up closer to home. 2) No further recommendations, neurology will sign off.   Ritta Slot, MD [facility] [phone]  If 7pm- 7am, please page neurology on call at [phone].

## 2013-10-20 NOTE — Progress Notes (Signed)
CARDIAC REHAB PHASE I   PRE:  Rate/Rhythm: 86 SR  BP:  Supine: 118/75  Sitting:   Standing:    SaO2: 97 RA  MODE:  Ambulation: 350 ft   POST:  Rate/Rhythm: 109 ST  BP:  Supine:   Sitting: 104/74  Standing:    SaO2: 99 RA 1115-1140 Assisted X 1 to ambulate. Pt a little unsteady, as she increased her distance it improved. She was able to walk 350 fet only c/o was of being tired. VS stable Pt back to side of bed after walk, getting ready to go to xray. I will follow pt later to complete MI education.  Melina Copa RN 10/20/2013 11:43 AM

## 2013-10-21 LAB — BASIC METABOLIC PANEL
BUN: 26 mg/dL — ABNORMAL HIGH (ref 6–23)
Calcium: 9.5 mg/dL (ref 8.4–10.5)
Chloride: 99 mEq/L (ref 96–112)
Creatinine, Ser: 0.99 mg/dL (ref 0.50–1.10)
GFR calc non Af Amer: 64 mL/min — ABNORMAL LOW (ref >90)
Glucose, Bld: 88 mg/dL (ref 70–99)
Sodium: 138 mEq/L (ref 135–145)

## 2013-10-21 LAB — CBC
MCH: 30.4 pg (ref 26.0–34.0)
MCHC: 33.8 g/dL (ref 30.0–36.0)
Platelets: 138 10*3/uL — ABNORMAL LOW (ref 150–400)
RDW: 14.4 % (ref 11.5–15.5)
WBC: 7.3 10*3/uL (ref 4.0–10.5)

## 2013-10-21 LAB — URINE CULTURE: Special Requests: NORMAL

## 2013-10-21 MED ORDER — ALPRAZOLAM 0.5 MG PO TABS: 0.5000 mg | ORAL_TABLET | Freq: Two times a day (BID) | ORAL | Status: DC | PRN

## 2013-10-21 MED ORDER — POTASSIUM CHLORIDE 20 MEQ/15ML (10%) PO LIQD
40.0000 meq | Freq: Once | ORAL | Status: DC
  Filled 2013-10-21: qty 30

## 2013-10-21 MED ORDER — CEPHALEXIN 500 MG PO CAPS: 500.0000 mg | ORAL_CAPSULE | Freq: Four times a day (QID) | ORAL | Status: DC

## 2013-10-21 MED ORDER — FUROSEMIDE 40 MG PO TABS: 40.0000 mg | ORAL_TABLET | Freq: Every day | ORAL | Status: DC

## 2013-10-21 MED ORDER — LISINOPRIL 2.5 MG PO TABS: 2.5000 mg | ORAL_TABLET | Freq: Every day | ORAL | Status: DC

## 2013-10-21 MED ORDER — LEVETIRACETAM 500 MG PO TABS: 500.0000 mg | ORAL_TABLET | Freq: Two times a day (BID) | ORAL | Status: DC

## 2013-10-21 MED ORDER — POTASSIUM CHLORIDE CRYS ER 20 MEQ PO TBCR: 20.0000 meq | EXTENDED_RELEASE_TABLET | Freq: Two times a day (BID) | ORAL | Status: DC

## 2013-10-21 MED ORDER — CARVEDILOL 6.25 MG PO TABS: 6.2500 mg | ORAL_TABLET | Freq: Two times a day (BID) | ORAL | Status: DC

## 2013-10-21 MED ORDER — POTASSIUM CHLORIDE CRYS ER 20 MEQ PO TBCR
20.0000 meq | EXTENDED_RELEASE_TABLET | Freq: Two times a day (BID) | ORAL | Status: DC
  Filled 2013-10-21: qty 1

## 2013-10-21 NOTE — Discharge Summary (Addendum)
Physician Discharge Summary  Patient ID: Tara Dickson MRN: [id number] DOB/AGE: 05-28-1961 52 y.o.  Admit date: 10/18/2013 Discharge date: 10/21/2013  Primary Physician:  Dr. Lilyan Punt  Primary Discharge Diagnosis:  1. Acute systolic congestive heart failure thought due to stress-induced cardiomyopathy  Secondary Discharge Diagnosis: 2. Cardiomyopathy nonischemic possible Takasubo syndrome 3. Seizure disorder with abnormal EEG 4. Hyperlipidemia under treatment 5. Obesity 6. Anxiety 7. Possible aspiration pneumonia  Procedures:  EEG, echocardiogram, cardiac catheterization  Consults:  To [facility] neurology  [facility] Course: Tara Dickson is a 52 y.o. female who presents for further evaluation as a transfer from [facility]. She initially presented there Wed am after a suspected seizure at home. She was found by her fiancee disoriented. She does not recall anything until being put in ambulance. She states she did bite her tongue. She has a hx of seizures, but has been off medications for 3 years now. She was admitted with initial negative CT head. Initial troponin was 0.16. Serial troponins returned at 4.57 and 9.10. She reports no chest pain leading up to event and none since being admitted. She was transferred for further evaluation and management. She was given ASA and Heparin bolus prior to transfer.   Previous diagnostic testing for coronary artery disease includes: Negative dobutamine stress echo 12/2011.  Previous history of cardiac disease includes None. Coronary artery disease risk factors include: dyslipidemia and obesity (BMI >= 30 kg/m2). Patient denies history of angina, cardiomyopathy, coronary artery disease, previous M.I. and valvular disease. Previous Echo: 12/2011 Mild conc LVH. LV systolic function normal EF 65%, Mild MR, Mild AR, RV mildly dilated with normal function.  The patient was transferred to the hospital with abnormal troponin and a  possible seizure disorder. She underwent catheterization on October 30 with findings of a patent coronary arteries and an EF that showed anterior mid inferior hypokinesis and sparing of the apex did was thought to be possible consistent with a stress cardiomyopathy. Myocarditis cannot be excluded. She also had an EEG that was abnormal and had a neurologic consultation who favored lifelong treatment with seizure medications because of an abnormal EEG. She was placed on treatment with carvedilol and lisinopril as well as furosemide. She became hypokalemic and needed potassium supplementation. She was ambulatory in the hall and was seen by cardiac rehabilitation and is discharged home in improved condition. She will need to have a followup echocardiogram down the road.  A urinalysis returned with 90,000 colonies of Escherichia coli. She received a single dose of antibiotics for possible aspiration pneumonia. Followup two-view chest x-ray showed a patchy infiltrate in the left upper lung. She mentioned exposure to TB at some point and was recommended to have a TB test as an outpatient. Her white count normalized and she had no fever but she was discharged on cephalexin 500 3 times a day to cover both of these things. Discharge Exam: Blood pressure 88/58, pulse 64, temperature 97.4 F (36.3 C), temperature source Oral, resp. rate 20, height 5' 8.5" (1.74 m), weight 95.1 kg (209 lb 10.5 oz), SpO2 96.00%.   Lungs clear, no S3  Labs: CBC:   Lab Results  Component Value Date   WBC 7.3 10/21/2013   HGB 11.3* 10/21/2013   HCT 33.4* 10/21/2013   MCV 89.8 10/21/2013   PLT 138* 10/21/2013    CMP:  Recent Labs Lab 10/18/13 0718  10/21/13 0535  NA 141  < > 138  K 3.4*  < > 3.0*  CL 103  < >  99  CO2 23  < > 29  BUN 13  < > 26*  CREATININE 0.90  < > 0.99  CALCIUM 9.7  < > 9.5  PROT 7.7  --   --   BILITOT 0.6  --   --   ALKPHOS 77  --   --   ALT 18  --   --   AST 65*  --   --   GLUCOSE 138*  < > 88  <  > = values in this interval not displayed.  Lipid Panel     Component Value Date/Time   CHOL 230* 10/18/2013 0713   TRIG 101 10/18/2013 0713   HDL 55 10/18/2013 0713   CHOLHDL 4.2 10/18/2013 0713   VLDL 20 10/18/2013 0713   LDLCALC 155* 10/18/2013 0713    Cardiac Enzymes:  Recent Labs  10/18/13 1818  TROPONINI 19.66*    BNP (last 3 results)  Recent Labs  10/18/13 0550  PROBNP 15394.0*   Hemoglobin A1C: Lab Results  Component Value Date   HGBA1C 5.4 10/18/2013     Radiology: Patchy infiltrate in  central left upper lung. Normal heart size  EKG: Voltage for LVH  Discharge Medications:   Medication List         ALPRAZolam 0.5 MG tablet  Commonly known as:  XANAX  Take 1 tablet (0.5 mg total) by mouth 2 (two) times daily as needed for anxiety.     carvedilol 6.25 MG tablet  Commonly known as:  COREG  Take 1 tablet (6.25 mg total) by mouth 2 (two) times daily with a meal.     furosemide 40 MG tablet  Commonly known as:  LASIX  Take 1 tablet (40 mg total) by mouth daily.     levETIRAcetam 500 MG tablet  Commonly known as:  KEPPRA  Take 1 tablet (500 mg total) by mouth 2 (two) times daily.     lidocaine 5 %  Commonly known as:  LIDODERM  Place 1 patch onto the skin daily. Remove & Discard patch within 12 hours or as directed by MD     lisinopril 2.5 MG tablet  Commonly known as:  PRINIVIL,ZESTRIL  Take 1 tablet (2.5 mg total) by mouth daily.  Start taking on:  10/22/2013     omeprazole 20 MG capsule  Commonly known as:  PRILOSEC  Take 20 mg by mouth daily.     potassium chloride SA 20 MEQ tablet  Commonly known as:  K-DUR,KLOR-CON  Take 1 tablet (20 mEq total) by mouth 2 (two) times daily.     pravastatin 40 MG tablet  Commonly known as:  PRAVACHOL  TAKE (1) TABLET BY MOUTH ONCE DAILY.     sertraline 50 MG tablet  Commonly known as:  ZOLOFT  Take 75 mg by mouth daily.         Cephalexin 500 mg        Take 3 times a day for 5 days for  possible pneumonia and UTI     Followup plans and appointments: Followup with Dr. Katrinka Blazing in one to 2 weeks.  Followup with Dr. Lilyan Punt in one week.  Followup with [facility] in 2 weeks for followup of seizures.  Time spent with patient to include physician time:  45 minutes  Signed: W. Ashley Royalty. MD [facility] 10/21/2013, 11:42 AM

## 2013-10-21 NOTE — Progress Notes (Signed)
ANTIBIOTIC CONSULT NOTE - FOLLOW UP  Pharmacy Consult for Zosyn Indication: rule out pneumonia  No Known Allergies  Patient Measurements: Height: 5' 8.5" (174 cm) Weight: 209 lb 10.5 oz (95.1 kg) IBW/kg (Calculated) : 65.05 Adjusted Body Weight:   Vital Signs: Temp: 97.4 F (36.3 C) (11/02 0401) Temp src: Oral (11/02 0401) BP: 88/58 mmHg (11/02 0923) Pulse Rate: 64 (11/02 0401) Intake/Output from previous day: 11/01 0701 - 11/02 0700 In: 700 [P.O.:700] Out: 700 [Urine:700] Intake/Output from this shift: Total I/O In: -  Out: 200 [Urine:200]  Labs:  Recent Labs  10/18/13 1756  10/19/13 0845 10/20/13 0352 10/21/13 0535  WBC  --   < > 21.1* 15.9* 7.3  HGB 15.6*  < > 13.1 12.4 11.3*  PLT  --   < > 189 152 138*  CREATININE 0.90  --   --  0.96 0.99  < > = values in this interval not displayed. Estimated Creatinine Clearance: 80 ml/min (by C-G formula based on Cr of 0.99). No results found for this basename: VANCOTROUGH, Leodis Binet, VANCORANDOM, GENTTROUGH, GENTPEAK, GENTRANDOM, TOBRATROUGH, TOBRAPEAK, TOBRARND, AMIKACINPEAK, AMIKACINTROU, AMIKACIN,  in the last 72 hours   Microbiology: Recent Results (from the past 720 hour(s))  MRSA PCR SCREENING     Status: None   Collection Time    10/18/13  5:17 AM      Result Value Range Status   MRSA by PCR NEGATIVE  NEGATIVE Final   Comment:            The GeneXpert MRSA Assay (FDA     approved for NASAL specimens     only), is one component of a     comprehensive MRSA colonization     surveillance program. It is not     intended to diagnose MRSA     infection nor to guide or     monitor treatment for     MRSA infections.  CULTURE, BLOOD (ROUTINE X 2)     Status: None   Collection Time    10/19/13  8:44 AM      Result Value Range Status   Specimen Description BLOOD RIGHT ANTECUBITAL   Final   Special Requests BOTTLES DRAWN AEROBIC AND ANAEROBIC 10CC   Final   Culture  Setup Time     Final   Value: 10/19/2013 13:35      Performed at [facility]   Culture     Final   Value:        BLOOD CULTURE RECEIVED NO GROWTH TO DATE CULTURE WILL BE HELD FOR 5 DAYS BEFORE ISSUING A FINAL NEGATIVE REPORT     Performed at [facility]   Report Status PENDING   Incomplete  CULTURE, BLOOD (ROUTINE X 2)     Status: None   Collection Time    10/19/13  8:52 AM      Result Value Range Status   Specimen Description BLOOD HAND RIGHT   Final   Special Requests BOTTLES DRAWN AEROBIC AND ANAEROBIC 10CC   Final   Culture  Setup Time     Final   Value: 10/19/2013 13:35     Performed at [facility]   Culture     Final   Value:        BLOOD CULTURE RECEIVED NO GROWTH TO DATE CULTURE WILL BE HELD FOR 5 DAYS BEFORE ISSUING A FINAL NEGATIVE REPORT     Performed at [facility]   Report Status PENDING  Incomplete  URINE CULTURE     Status: None   Collection Time    10/19/13  9:14 AM      Result Value Range Status   Specimen Description URINE, CLEAN CATCH   Final   Special Requests none Normal   Final   Culture  Setup Time     Final   Value: 10/19/2013 10:30     Performed at [facility] Count     Final   Value: 90,000 COLONIES/ML     Performed at [facility]   Culture     Final   Value: ESCHERICHIA COLI     Performed at [facility]   Report Status 10/21/2013 FINAL   Final   Organism ID, Bacteria ESCHERICHIA COLI   Final    Anti-infectives   Start     Dose/Rate Route Frequency Ordered Stop   10/19/13 1000  piperacillin-tazobactam (ZOSYN) IVPB 3.375 g  Status:  Discontinued     3.375 g 12.5 mL/hr over 240 Minutes Intravenous 3 times per day 10/19/13 0912 10/20/13 0950      Assessment: 52yo female with R/O aspiration pneumonia, on day#2 of Zosyn.  Blood cultures are NTD, but Urine Cx (+)E.Coli and is sensitive to Zosyn.  No dosage adjustment necessary for renal function.    Goal of Therapy:  resolution of infection  Plan:  Continue Zosyn  3.375gm IV q8, 4 hr infusion Pharmacy will sign-off, please reconsult as needed.  Marisue Humble, PharmD Clinical Pharmacist [facility]

## 2013-10-24 LAB — TROPONIN I: Troponin I: <0.3 ng/mL (ref <0.30)

## 2013-10-25 LAB — CULTURE, BLOOD (ROUTINE X 2): Culture: NO GROWTH

## 2013-11-02 ENCOUNTER — Encounter: Payer: Self-pay | Admitting: Family Medicine

## 2013-11-02 ENCOUNTER — Ambulatory Visit (INDEPENDENT_AMBULATORY_CARE_PROVIDER_SITE_OTHER): Payer: Medicare Other | Admitting: Family Medicine

## 2013-11-02 VITALS — BP 102/70 | Ht 68.5 in | Wt 213.5 lb

## 2013-11-02 DIAGNOSIS — I951 Orthostatic hypotension: Secondary | ICD-10-CM

## 2013-11-02 DIAGNOSIS — E876 Hypokalemia: Secondary | ICD-10-CM

## 2013-11-02 DIAGNOSIS — R569 Unspecified convulsions: Secondary | ICD-10-CM

## 2013-11-02 MED ORDER — PRAVASTATIN SODIUM 40 MG PO TABS: ORAL_TABLET | ORAL | Status: DC

## 2013-11-02 NOTE — Progress Notes (Signed)
  Subjective:    Patient ID: Tara Dickson, female    DOB: March 13, 1961, 52 y.o.   MRN: [id number]  HPI Patient is here today for a follow up visit from [facility]. Patient was admitted to hospital on 10/18/13 for seizures and heart attack. Patient states that she is still experiencing dizziness and extreme fatigue.   Long discussion held regarding her recent hospitalization and testing that was completed. Patient overall is doing fairly well today. PMH benign patient has a remote seizure history Review of Systems Fatigue tiredness sleepiness during the day denies chest tightness pressure pain shortness of breath. Patient does state she gets a little dizzy when she stands up.    Objective:   Physical Exam Eardrums normal throat normal neck supple lungs are clear no crackles heart regular pulse normal extremities no edema skin warm dry       Assessment & Plan:  #1 orthostatic hypotension-reduce Lasix from 40 mg down to 20 mg. Followup with cardiology. Recheck here in 4 weeks. #2 seizures-no driving. Will refer to local neurology. Patient may need further testing but at this point looks like all testing has been done that is necessary. Patient to stay on medication most likely will need to for life patient also has significant fatigue and tiredness which raises the question the possibility of sleep apnea. Possibly neurology may need to do sleep study as well.  #3 hypokalemia-recheck lab work with a metabolic 7 Greater and 25 minutes spent reviewing hospital records with the patient plus also discussing referrals and diagnosis.

## 2013-11-07 ENCOUNTER — Encounter: Payer: Self-pay | Admitting: Family Medicine

## 2013-11-07 LAB — BASIC METABOLIC PANEL
BUN: 14 mg/dL (ref 6–23)
CO2: 24 mEq/L (ref 19–32)
Calcium: 9.9 mg/dL (ref 8.4–10.5)
Chloride: 104 mEq/L (ref 96–112)
Creat: 1.07 mg/dL (ref 0.50–1.10)
Glucose, Bld: 105 mg/dL — ABNORMAL HIGH (ref 70–99)
Potassium: 4.4 mEq/L (ref 3.5–5.3)
Sodium: 137 mEq/L (ref 135–145)

## 2013-11-10 ENCOUNTER — Encounter: Payer: Self-pay | Admitting: Interventional Cardiology

## 2013-11-12 ENCOUNTER — Ambulatory Visit (INDEPENDENT_AMBULATORY_CARE_PROVIDER_SITE_OTHER): Payer: Medicare Other | Admitting: Interventional Cardiology

## 2013-11-12 ENCOUNTER — Encounter: Payer: Self-pay | Admitting: Interventional Cardiology

## 2013-11-12 VITALS — BP 110/64 | HR 78 | Ht 68.0 in | Wt 218.0 lb

## 2013-11-12 DIAGNOSIS — F319 Bipolar disorder, unspecified: Secondary | ICD-10-CM

## 2013-11-12 DIAGNOSIS — I5181 Takotsubo syndrome: Secondary | ICD-10-CM

## 2013-11-12 DIAGNOSIS — I5021 Acute systolic (congestive) heart failure: Secondary | ICD-10-CM

## 2013-11-12 DIAGNOSIS — I214 Non-ST elevation (NSTEMI) myocardial infarction: Secondary | ICD-10-CM

## 2013-11-12 MED ORDER — CARVEDILOL 6.25 MG PO TABS: 6.2500 mg | ORAL_TABLET | Freq: Two times a day (BID) | ORAL | Status: DC

## 2013-11-12 MED ORDER — LISINOPRIL 2.5 MG PO TABS: 2.5000 mg | ORAL_TABLET | Freq: Every day | ORAL | Status: DC

## 2013-11-12 NOTE — Progress Notes (Signed)
Patient ID: Tara Dickson, female   DOB: 07-02-1961, 52 y.o.   MRN: [id number]    [address], [address] [place], [place]  [address] Phone: [phone] Fax:  [phone]  Date:  11/12/2013   ID:  Tara Dickson, DOB 03/14/1961, MRN [id number]  PCP:  Lilyan Punt, MD   ASSESSMENT:  1. 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 Also in the differential was mild carditis but her clinical course did not seem to be compatible with that diagnosis 2. Bipolar disorder 3. Seizure disorder  PLAN:  1. Discontinue Lasix and potassium supplements 2. 2-D echocardiogram in one month 3. Consider further weaning and discontinuation of newly added therapies (carvedilol and lisinopril) as the patient seems to have side effects. We'll do this only if LV function has normalized. If there is still significant systolic dysfunction, she will need to remain on part 3 of therapy. 4. Cautioned to call the shortness of breath develops off diuretic therapy   SUBJECTIVE: Tara Dickson is a 52 y.o. female who complains of orthostatic dizziness. She is otherwise doing well with the exception of headaches since discharge from the hospital. She denies dyspnea and chest pain. She has a seizure disorder with no recurrence since discharge from the hospital.. She requests to be followed in [place] because she has a difficult time getting the [place]. Workup during the hospital stay suggested a stress cardiomyopathy, possibly precipitated by the seizure that she had on the day of admission. She had extremely elevated BNP and hypoxia but did not complain of shortness of breath. Diuresis led to dramatic reduction in interstitial edema on chest x-ray and reduction in BNP. She denied chest pain during the hospital stay despite elevated cardiac markers. Catheterization demonstrated widely patent coronaries with wall motion abnormalities atypical for  stress cardiomyopathy but under the circumstances still suspicious. She was discharged from the hospital on part 3 of therapy which will be weaned and discontinued assuming left ventricular recovery.   Wt Readings from Last 3 Encounters:  11/12/13 218 lb (98.884 kg)  11/02/13 213 lb 8 oz (96.843 kg)  10/21/13 209 lb 10.5 oz (95.1 kg)     Past Medical History  Diagnosis Date  . Hypertension   . Anxiety   . Depression   . Bipolar 1 disorder   . Melanoma     Current Outpatient Prescriptions  Medication Sig Dispense Refill  . ALPRAZolam (XANAX) 0.5 MG tablet Take 1 tablet (0.5 mg total) by mouth 2 (two) times daily as needed for anxiety.  1 tablet  0  . aspirin 81 MG tablet Take 81 mg by mouth daily.      . carvedilol (COREG) 6.25 MG tablet Take 1 tablet (6.25 mg total) by mouth 2 (two) times daily with a meal.  60 tablet  0  . furosemide (LASIX) 40 MG tablet Take 20 mg by mouth.      . levETIRAcetam (KEPPRA) 500 MG tablet Take 1 tablet (500 mg total) by mouth 2 (two) times daily.  60 tablet  0  . lidocaine (LIDODERM) 5 % Place 1 patch onto the skin daily. Remove & Discard patch within 12 hours or as directed by MD      . lisinopril (PRINIVIL,ZESTRIL) 2.5 MG tablet Take 1 tablet (2.5 mg total) by mouth daily.  30 tablet  0  . omeprazole (PRILOSEC) 20 MG capsule Take 20 mg by mouth daily.      . potassium chloride (K-DUR,KLOR-CON)  10 MEQ tablet Take 10 mEq by mouth 2 (two) times daily.      . pravastatin (PRAVACHOL) 40 MG tablet TAKE (1) TABLET BY MOUTH ONCE DAILY.  30 tablet  4  . sertraline (ZOLOFT) 100 MG tablet Take 100 mg by mouth. 1 1/2 tab daily       No current facility-administered medications for this visit.    Allergies:   No Known Allergies  Social History:  The patient  reports that she quit smoking about 16 years ago. Her smoking use included Cigarettes. She smoked 0.00 packs per day. She does not have any smokeless tobacco history on file. She reports that she does not  drink alcohol or use illicit drugs.   ROS:  Please see the history of present illness.   Headache, orthostatic dizziness, lethargy.   All other systems reviewed and negative.   OBJECTIVE: VS:  BP 110/64  Pulse 78  Ht 5\' 8"  (1.727 m)  Wt 218 lb (98.884 kg)  BMI 33.15 kg/m2 Well nourished, well developed, in no acute distress, obese, flat affect HEENT: normal Neck: JVD flat. Carotid bruit absent  Cardiac:  normal S1, S2; RRR; no murmur Lungs:  clear to auscultation bilaterally, no wheezing, rhonchi or rales Abd: soft, nontender, no hepatomegaly Ext: Edema absent. Pulses 2+ Skin: warm and dry Neuro:  CNs 2-12 intact, no focal abnormalities noted. Flat affect  EKG:   Not repeated.       Signed, Darci Needle III, MD 11/12/2013 1:41 PM

## 2013-11-12 NOTE — Patient Instructions (Addendum)
Stop Lasix and Potassium. Continue all other medications as prescribed.  Your physician has requested that you have an echocardiogram. Echocardiography is a painless test that uses sound waves to create images of your heart. It provides your doctor with information about the size and shape of your heart and how well your heart's chambers and valves are working. This procedure takes approximately one hour. There are no restrictions for this procedure.  Your echo is scheduled on 12/10/13 @10 :15 am at [facility]. Report to the main entrance  You have a follow up appt  At our [place] office with Dr.Koneswaran 12/25/13 @9  am. The office telephone number is [phone]

## 2013-11-22 ENCOUNTER — Other Ambulatory Visit: Payer: Self-pay | Admitting: Family Medicine

## 2013-11-22 ENCOUNTER — Other Ambulatory Visit: Payer: Self-pay

## 2013-11-22 MED ORDER — LEVETIRACETAM 500 MG PO TABS: 500.0000 mg | ORAL_TABLET | Freq: Two times a day (BID) | ORAL | Status: DC

## 2013-12-10 ENCOUNTER — Ambulatory Visit (HOSPITAL_COMMUNITY)
Admission: RE | Admit: 2013-12-10 | Discharge: 2013-12-10 | Disposition: A | Discharge status: Home / Self Care | Payer: Medicare Other | Source: Ambulatory Visit | Attending: Interventional Cardiology | Admitting: Interventional Cardiology

## 2013-12-10 DIAGNOSIS — I428 Other cardiomyopathies: Secondary | ICD-10-CM | POA: Insufficient documentation

## 2013-12-10 DIAGNOSIS — I509 Heart failure, unspecified: Secondary | ICD-10-CM | POA: Insufficient documentation

## 2013-12-10 DIAGNOSIS — I10 Essential (primary) hypertension: Secondary | ICD-10-CM | POA: Insufficient documentation

## 2013-12-10 DIAGNOSIS — I359 Nonrheumatic aortic valve disorder, unspecified: Secondary | ICD-10-CM

## 2013-12-10 DIAGNOSIS — I5021 Acute systolic (congestive) heart failure: Secondary | ICD-10-CM

## 2013-12-10 NOTE — Progress Notes (Signed)
*  PRELIMINARY RESULTS* Echocardiogram 2D Echocardiogram has been performed.  Elise Knobloch, Zadie 12/10/2013, 11:26 AM

## 2013-12-21 ENCOUNTER — Other Ambulatory Visit: Payer: Self-pay | Admitting: Family Medicine

## 2013-12-25 ENCOUNTER — Ambulatory Visit (INDEPENDENT_AMBULATORY_CARE_PROVIDER_SITE_OTHER): Payer: Medicare Other | Admitting: Cardiovascular Disease

## 2013-12-25 ENCOUNTER — Encounter: Payer: Self-pay | Admitting: Cardiovascular Disease

## 2013-12-25 VITALS — BP 151/83 | HR 59 | Ht 68.5 in | Wt 217.0 lb

## 2013-12-25 DIAGNOSIS — I5181 Takotsubo syndrome: Secondary | ICD-10-CM

## 2013-12-25 DIAGNOSIS — I10 Essential (primary) hypertension: Secondary | ICD-10-CM

## 2013-12-25 MED ORDER — LISINOPRIL 5 MG PO TABS: 5.0000 mg | ORAL_TABLET | Freq: Every day | ORAL | Status: DC

## 2013-12-25 MED ORDER — CARVEDILOL 3.125 MG PO TABS: 3.1250 mg | ORAL_TABLET | Freq: Two times a day (BID) | ORAL | Status: DC

## 2013-12-25 NOTE — Patient Instructions (Addendum)
Your physician recommends that you schedule a follow-up appointment in:4 months   Your physician has recommended you make the following change in your medication:    DECREASE Coreg to 3.125 mg twice   INCREASE Lisinopril to 5 mg daily

## 2013-12-25 NOTE — Progress Notes (Signed)
Patient ID: Tara Dickson, female   DOB: [birth year], 53 y.o.   MRN: [id number]      SUBJECTIVE: The patient is a 53 year old woman who was recently hospitalized for a seizure which was believed to have led to a stress induced cardiomyopathy. During her hospitalization she had elevated troponins, pulmonary edema, and an elevated BNP. She underwent cardiac catheterization which revealed widely patent coronary arteries. Echocardiography revealed severely reduced left ventricular systolic function with diffuse hypokinesis, EF 25-30%. She was started on carvedilol, lisinopril, and Lasix. A recently completed echocardiogram showed normalization of left ventricular systolic function, with an ejection fraction of 50-55%. The left atrium was moderately dilated with bowing of the intra-atrial septum from left to right indicative of elevated left atrial pressures. She also has grade II diastolic dysfunction and elevated LVEDP. She remains hypertensive. She denies chest pain and shortness of breath. She has minimal leg swelling and is off of diuretics altogether. She has been referred for a sleep study which has not been completed yet. She has numerous social stressors at home which involve her brother.    No Known Allergies  Current Outpatient Prescriptions  Medication Sig Dispense Refill  . ALPRAZolam (XANAX) 0.5 MG tablet Take 0.5 mg by mouth 3 (three) times daily as needed for anxiety.      Marland Kitchen aspirin 81 MG tablet Take 81 mg by mouth daily.      . carvedilol (COREG) 6.25 MG tablet Take 1 tablet (6.25 mg total) by mouth 2 (two) times daily with a meal.  60 tablet  2  . levETIRAcetam (KEPPRA) 500 MG tablet TAKE (1) TABLET TWICE DAILY.  60 tablet  5  . lidocaine (LIDODERM) 5 % Place 1 patch onto the skin as needed. Remove & Discard patch within 12 hours or as directed by MD      . lisinopril (PRINIVIL,ZESTRIL) 2.5 MG tablet Take 1 tablet (2.5 mg total) by mouth daily.  30 tablet  2  . omeprazole (PRILOSEC)  20 MG capsule TAKE 1 CAPSULE BY MOUTH ONCE A DAY.  30 capsule  5  . pravastatin (PRAVACHOL) 40 MG tablet TAKE (1) TABLET BY MOUTH ONCE DAILY.  30 tablet  4  . sertraline (ZOLOFT) 100 MG tablet Take 150 mg by mouth daily. 1 1/2 tab daily       No current facility-administered medications for this visit.    Past Medical History  Diagnosis Date  . Hypertension   . Anxiety   . Depression   . Bipolar 1 disorder   . Melanoma     No past surgical history on file.  History   Social History  . Marital Status: Single    Spouse Name: N/A    Number of Children: N/A  . Years of Education: N/A   Occupational History  . Not on file.   Social History Main Topics  . Smoking status: Former Smoker    Types: Cigarettes    Quit date: 10/18/1997  . Smokeless tobacco: Not on file  . Alcohol Use: No  . Drug Use: No  . Sexual Activity: Yes    Birth Control/ Protection: None   Other Topics Concern  . Not on file   Social History Narrative  . No narrative on file     Filed Vitals:   12/25/13 0857  BP: 151/83  Pulse: 59  Height: 5' 8.5" (1.74 m)  Weight: 217 lb (98.431 kg)    PHYSICAL EXAM General: NAD Neck: No JVD, no thyromegaly or thyroid  nodule.  Lungs: Clear to auscultation bilaterally with normal respiratory effort. CV: Nondisplaced PMI.  Heart regular S1/S2, no S3/S4, no murmur.  No peripheral edema.  No carotid bruit.  Normal pedal pulses.  Abdomen: Soft, nontender, no hepatosplenomegaly, no distention.  Neurologic: Alert and oriented x 3.  Psych: Normal affect. Extremities: No clubbing or cyanosis.   ECG: reviewed and available in electronic records.  - Left ventricle: The cavity size was normal. Wall thickness was increased in a pattern of mild LVH. Systolic function was normal. The estimated ejection fraction was in the range of 50% to 55%. Wall motion was normal; there were no regional wall motion abnormalities. Features are consistent with a pseudonormal left  ventricular filling pattern, with concomitant abnormal relaxation and increased filling pressure (grade 2 diastolic dysfunction). Doppler parameters are consistent with both elevated ventricular end-diastolic filling pressure and elevated left atrial filling pressure. - Aortic valve: Mild regurgitation. - Mitral valve: Mild regurgitation. - Left atrium: The atrium was moderately dilated. - Atrial septum: The septum bowed from left to right, consistent with increased left atrial pressure. No defect or patent foramen ovale was identified. - Tricuspid valve: Mild regurgitation. - Pulmonary arteries: PA peak pressure: 65mm Hg (S). Mild pulmonary arterial hypertension. - Inferior vena cava: The vessel was dilated; the respirophasic diameter changes were blunted (< 50%); findings are consistent with elevated central venous pressure. Impressions:  - When compared to the report dated 10/18/2013, left ventricular systolic function has normalized, with the EF improving from 25-30% to 50-55%.      ASSESSMENT AND PLAN: 1. Stress-induced cardiomyopathy: her LV systolic function has since normalized. She does remain fatigued and has been referred for a sleep study. Her HR is 59 bpm. I will reduce Coreg to 3.125 mg bid and may consider discontinuing it in the future. Her BP needs further optimization and I will increase lisinopril to 5 mg daily, and this may need to be increased even further, given her high left ventricular and left atrial pressures. 2. Hypertension: remains uncontrolled. Medication adjustments as noted above with increase in lisinopril.  Dispo: f/u 4 months.   Kate Sable, M.D., F.A.C.C.

## 2014-01-02 ENCOUNTER — Ambulatory Visit (INDEPENDENT_AMBULATORY_CARE_PROVIDER_SITE_OTHER): Payer: Medicare Other | Admitting: Family Medicine

## 2014-01-02 ENCOUNTER — Encounter: Payer: Self-pay | Admitting: Family Medicine

## 2014-01-02 VITALS — BP 138/98 | Ht 68.5 in | Wt 223.0 lb

## 2014-01-02 DIAGNOSIS — S40012A Contusion of left shoulder, initial encounter: Secondary | ICD-10-CM

## 2014-01-02 DIAGNOSIS — E785 Hyperlipidemia, unspecified: Secondary | ICD-10-CM | POA: Insufficient documentation

## 2014-01-02 DIAGNOSIS — S40019A Contusion of unspecified shoulder, initial encounter: Secondary | ICD-10-CM

## 2014-01-02 DIAGNOSIS — Z85820 Personal history of malignant melanoma of skin: Secondary | ICD-10-CM

## 2014-01-02 DIAGNOSIS — I10 Essential (primary) hypertension: Secondary | ICD-10-CM

## 2014-01-02 NOTE — Progress Notes (Signed)
   Subjective:    Patient ID: Tara Dickson, female    DOB: 10/09/1961, 53 y.o.   MRN: [id number]  HPIHere for a follow up.   Balance is off. Worse this past week. Fell yesterday hurt left shoulder. Patient was warned not to stand up quickly while on blood pressure medicines. She states her blood pressures been mildly elevated recently. She denies any chest shortness breath wheezing vomiting diarrhea. Her past medical history was reviewed.    Review of Systems  Constitutional: Negative for activity change, appetite change and fatigue.  HENT: Negative for congestion, ear discharge and rhinorrhea.   Eyes: Negative for discharge.  Respiratory: Negative for cough, chest tightness and wheezing.   Cardiovascular: Negative for chest pain.  Gastrointestinal: Negative for vomiting and abdominal pain.  Genitourinary: Negative for frequency and difficulty urinating.  Musculoskeletal: Negative for neck pain.  Allergic/Immunologic: Negative for environmental allergies and food allergies.  Neurological: Positive for weakness and light-headedness. Negative for headaches.  Psychiatric/Behavioral: Negative for behavioral problems and agitation.       Objective:   Physical Exam There is tenderness in the left shoulder mainly around the distal clavicle there is no deformity of also some tenderness in trapezius her lungs are clear there is no crackles heart is regular. Pulses normal blood pressure on recheck 138/92 extremities no edema skin warm dry I skin examination of her legs and her back and upper chest was completed no sign of any type of melanoma.       Assessment & Plan:  History of melanoma 2010 no sign of reoccurrence Recent seizure following up with neurology already has appointment HTN-lisinopril at 5 mg followup again in 3-4 weeks check metabolic 7 Shoulder contusion should gradually get better Hyperlipidemia lab work ordered.

## 2014-01-07 ENCOUNTER — Other Ambulatory Visit: Payer: Self-pay | Admitting: Interventional Cardiology

## 2014-01-11 ENCOUNTER — Encounter: Payer: Self-pay | Admitting: Family Medicine

## 2014-01-11 LAB — LIPID PANEL
Cholesterol: 160 mg/dL (ref 0–200)
HDL: 37 mg/dL — AB (ref >39)
LDL Cholesterol: 99 mg/dL (ref 0–99)
Total CHOL/HDL Ratio: 4.3 Ratio
Triglycerides: 122 mg/dL (ref <150)
VLDL: 24 mg/dL (ref 0–40)

## 2014-01-11 LAB — HEPATIC FUNCTION PANEL
ALBUMIN: 4.1 g/dL (ref 3.5–5.2)
ALK PHOS: 73 U/L (ref 39–117)
ALT: 11 U/L (ref 0–35)
AST: 14 U/L (ref 0–37)
Bilirubin, Direct: 0.1 mg/dL (ref 0.0–0.3)
Indirect Bilirubin: 0.4 mg/dL (ref 0.0–0.9)
TOTAL PROTEIN: 7.3 g/dL (ref 6.0–8.3)
Total Bilirubin: 0.5 mg/dL (ref 0.3–1.2)

## 2014-01-11 LAB — BASIC METABOLIC PANEL
BUN: 7 mg/dL (ref 6–23)
CALCIUM: 9.7 mg/dL (ref 8.4–10.5)
CO2: 28 mEq/L (ref 19–32)
CREATININE: 0.72 mg/dL (ref 0.50–1.10)
Chloride: 107 mEq/L (ref 96–112)
Glucose, Bld: 97 mg/dL (ref 70–99)
Potassium: 3.6 mEq/L (ref 3.5–5.3)
Sodium: 145 mEq/L (ref 135–145)

## 2014-02-20 ENCOUNTER — Other Ambulatory Visit: Payer: Self-pay | Admitting: Family Medicine

## 2014-02-20 NOTE — Telephone Encounter (Signed)
A not sure how this patient is currently using this I would recommend calling her, discussing it with her then we can reorder accordingly

## 2014-02-20 NOTE — Telephone Encounter (Signed)
Seen 10/23/14

## 2014-02-21 NOTE — Telephone Encounter (Signed)
Patient uses the patch for her back pain. She said there is usually 30 patches to a box, and 1 box lasts her about 2 months. (She did use more patches this past week d/t the flu.)

## 2014-03-08 ENCOUNTER — Other Ambulatory Visit (HOSPITAL_COMMUNITY): Payer: Self-pay

## 2014-03-08 DIAGNOSIS — G4730 Sleep apnea, unspecified: Secondary | ICD-10-CM

## 2014-03-13 ENCOUNTER — Encounter: Payer: Self-pay | Admitting: Neurology

## 2014-03-13 ENCOUNTER — Ambulatory Visit: Payer: Medicare Other | Attending: Neurology | Admitting: Sleep Medicine

## 2014-03-13 DIAGNOSIS — G4730 Sleep apnea, unspecified: Secondary | ICD-10-CM | POA: Insufficient documentation

## 2014-03-22 NOTE — Sleep Study (Signed)
  Tara A. Merlene Laughter, MD     [email or web address]          LOCATION: SLEEP LAB FACILITY: [facility]  PHYSICIAN: Tymere Depuy A. Merlene Dickson, M.D.   DATE OF STUDY: 03/13/2014.  NOCTURNAL POLYSOMNOGRAM   REFERRING PHYSICIAN: [name]  INDICATIONS: This is a 53 year old who presents with restless sleep, jerking, seizures, fatigue and difficulty sleeping.  MEDICATIONS:  Prior to Admission medications   Medication Sig Start Date End Date Taking? Authorizing Provider  ALPRAZolam [name]) 0.5 MG tablet Take 0.5 mg by mouth 3 (three) times daily as needed for anxiety. 10/21/13   Jacolyn Reedy, MD  aspirin 81 MG tablet Take 81 mg by mouth daily.    Historical Provider, MD  carvedilol (COREG) 3.125 MG tablet Take 1 tablet (3.125 mg total) by mouth 2 (two) times daily. 12/25/13   Herminio Commons, MD  levETIRAcetam (KEPPRA) 500 MG tablet TAKE (1) TABLET TWICE DAILY. 11/22/13   Scott A Luking, MD  LIDODERM 5 % APPLY 1 PATCH FOR UP TO 12 HOURS PER DAY.    Kathyrn Drown, MD  lisinopril (PRINIVIL,ZESTRIL) 5 MG tablet Take 1 tablet (5 mg total) by mouth daily. 12/25/13   Herminio Commons, MD  omeprazole (PRILOSEC) 20 MG capsule TAKE 1 CAPSULE BY MOUTH ONCE A DAY. 12/21/13   Kathyrn Drown, MD  pravastatin (PRAVACHOL) 40 MG tablet TAKE (1) TABLET BY MOUTH ONCE DAILY. 11/02/13   Kathyrn Drown, MD  sertraline (ZOLOFT) 100 MG tablet Take 150 mg by mouth daily. 1 1/2 tab daily    Historical Provider, MD      EPWORTH SLEEPINESS SCALE: 7.   BMI: 30.   ARCHITECTURAL SUMMARY: Total recording time was 381 minutes. Sleep efficiency 55 %. Sleep latency 114 minutes. REM latency 119 minutes. Stage NI 14 %, N2 68 % and N3 0 % and REM sleep 17 %.    RESPIRATORY DATA:  Baseline oxygen saturation is 96 %. The lowest saturation is 86 %. The diagnostic AHI is 5. The RDI is 5. The REM AHI is 10.  LIMB MOVEMENT SUMMARY: PLM index 0.   ELECTROCARDIOGRAM SUMMARY: Average heart rate is 58 with no  significant  dysrhythmias observed.   IMPRESSION:  1. Mild obstructive sleep apnea syndrome not requiring positive pressure treatment. 2. Abnormal sleep architecture with reduced the efficiency.  Thanks for this referral.  [name], M.D. Diplomat, [employer] adviser of Sleep Medicine.

## 2014-03-25 ENCOUNTER — Telehealth: Payer: Self-pay | Admitting: Family Medicine

## 2014-03-25 NOTE — Telephone Encounter (Signed)
Need help completing Rx prior auth form, see top of paper chart, it's for pt's LIDOCAINE Patch

## 2014-03-26 ENCOUNTER — Encounter: Payer: Self-pay | Admitting: *Deleted

## 2014-03-26 NOTE — Telephone Encounter (Signed)
Discussed with patient. Transferred to front to schedule office visit.  

## 2014-03-26 NOTE — Telephone Encounter (Signed)
Pt will need OV in order to eval her condition that she uses lidoderm patches for and to help get info that will help get it approved

## 2014-03-29 ENCOUNTER — Encounter: Payer: Self-pay | Admitting: Family Medicine

## 2014-03-29 ENCOUNTER — Ambulatory Visit (INDEPENDENT_AMBULATORY_CARE_PROVIDER_SITE_OTHER): Payer: Medicare Other | Admitting: Family Medicine

## 2014-03-29 VITALS — BP 138/82 | Ht 68.5 in | Wt 209.0 lb

## 2014-03-29 DIAGNOSIS — M545 Low back pain, unspecified: Secondary | ICD-10-CM | POA: Insufficient documentation

## 2014-03-29 DIAGNOSIS — G8929 Other chronic pain: Secondary | ICD-10-CM

## 2014-03-29 NOTE — Patient Instructions (Signed)
DASH Diet  The DASH diet stands for "Dietary Approaches to Stop Hypertension." It is a healthy eating plan that has been shown to reduce high blood pressure (hypertension) in as little as 14 days, while also possibly providing other significant health benefits. These other health benefits include reducing the risk of breast cancer after menopause and reducing the risk of type 2 diabetes, heart disease, colon cancer, and stroke. Health benefits also include weight loss and slowing kidney failure in patients with chronic kidney disease.   DIET GUIDELINES  · Limit salt (sodium). Your diet should contain less than 1500 mg of sodium daily.  · Limit refined or processed carbohydrates. Your diet should include mostly whole grains. Desserts and added sugars should be used sparingly.  · Include small amounts of heart-healthy fats. These types of fats include nuts, oils, and tub margarine. Limit saturated and trans fats. These fats have been shown to be harmful in the body.  CHOOSING FOODS   The following food groups are based on a 2000 calorie diet. See your Registered Dietitian for individual calorie needs.  Grains and Grain Products (6 to 8 servings daily)  · Eat More Often: Whole-wheat bread, brown rice, whole-grain or wheat pasta, quinoa, popcorn without added fat or salt (air popped).  · Eat Less Often: White bread, white pasta, white rice, cornbread.  Vegetables (4 to 5 servings daily)  · Eat More Often: Fresh, frozen, and canned vegetables. Vegetables may be raw, steamed, roasted, or grilled with a minimal amount of fat.  · Eat Less Often/Avoid: Creamed or fried vegetables. Vegetables in a cheese sauce.  Fruit (4 to 5 servings daily)  · Eat More Often: All fresh, canned (in natural juice), or frozen fruits. Dried fruits without added sugar. One hundred percent fruit juice (½ cup [237 mL] daily).  · Eat Less Often: Dried fruits with added sugar. Canned fruit in light or heavy syrup.  Lean Meats, Fish, and Poultry (2  servings or less daily. One serving is 3 to 4 oz [85-114 g]).  · Eat More Often: Ninety percent or leaner ground beef, tenderloin, sirloin. Round cuts of beef, chicken breast, turkey breast. All fish. Grill, bake, or broil your meat. Nothing should be fried.  · Eat Less Often/Avoid: Fatty cuts of meat, turkey, or chicken leg, thigh, or wing. Fried cuts of meat or fish.  Dairy (2 to 3 servings)  · Eat More Often: Low-fat or fat-free milk, low-fat plain or light yogurt, reduced-fat or part-skim cheese.  · Eat Less Often/Avoid: Milk (whole, 2%). Whole milk yogurt. Full-fat cheeses.  Nuts, Seeds, and Legumes (4 to 5 servings per week)  · Eat More Often: All without added salt.  · Eat Less Often/Avoid: Salted nuts and seeds, canned beans with added salt.  Fats and Sweets (limited)  · Eat More Often: Vegetable oils, tub margarines without trans fats, sugar-free gelatin. Mayonnaise and salad dressings.  · Eat Less Often/Avoid: Coconut oils, palm oils, butter, stick margarine, cream, half and half, cookies, candy, pie.  FOR MORE INFORMATION  The Dash Diet Eating Plan: www.dashdiet.org  Document Released: 11/25/2011 Document Revised: 02/28/2012 Document Reviewed: 11/25/2011  Exit[employer]® Patient Information ©2014 [employer].

## 2014-03-29 NOTE — Progress Notes (Signed)
   Subjective:    Patient ID: Tara Dickson, female    DOB: June 09, 1961, 53 y.o.   MRN: [id number]  HPI Pt is here today to get a refill on her Lidoderm patch d/t chronic low back pain.  She would also like to go over the sleep study results she had done  Using the patch most every day Uses for 12 hours Using for the past three years This helps avoid narcotic use  Review of Systems She denies wheezing vomiting diarrhea she does relate low back pain discomfort worsened to the legs    Objective:   Physical Exam  Lungs clear heart regular extremities no edema subjective low back pain negative straight leg raise      Assessment & Plan:  Patient is been having chronic low back pain ongoing worse without her medication lidocaine helps it prevents her from having to be on narcotics. It is done very well for her in the past he has not misused it. We will try to get it approved.

## 2014-04-19 ENCOUNTER — Telehealth: Payer: Self-pay | Admitting: Family Medicine

## 2014-04-19 NOTE — Telephone Encounter (Signed)
Pt's insurance DENIED her LIDOCAINE patch, we did form and appeal letter, please advise, see front of paper chart

## 2014-04-21 NOTE — Telephone Encounter (Signed)
Please let patent know that we have tried everything we can do in that Mrs. Not covered. Nothing more we can do. We can discuss alternatives at her next office visit

## 2014-04-22 ENCOUNTER — Other Ambulatory Visit: Payer: Self-pay | Admitting: Family Medicine

## 2014-04-22 NOTE — Telephone Encounter (Signed)
No contact numbers listed in Epic for this patient. Do you have a number we can call her at?

## 2014-04-23 ENCOUNTER — Other Ambulatory Visit: Payer: Self-pay | Admitting: Family Medicine

## 2014-04-23 NOTE — Telephone Encounter (Signed)
Spoke with patient, she thanked [place] for all we did trying to get med covered, she will call for an appointment if needed

## 2014-04-23 NOTE — Telephone Encounter (Signed)
Patient called you back and requested you call her back at [phone]

## 2014-04-23 NOTE — Telephone Encounter (Signed)
[facility] at # [phone] (only # in pt's registration screen), if no response will mail letter explaining below

## 2014-04-26 ENCOUNTER — Ambulatory Visit: Payer: Medicare Other | Admitting: Cardiovascular Disease

## 2014-05-17 ENCOUNTER — Encounter: Payer: Self-pay | Admitting: Cardiovascular Disease

## 2014-05-17 ENCOUNTER — Ambulatory Visit (INDEPENDENT_AMBULATORY_CARE_PROVIDER_SITE_OTHER): Payer: Medicare Other | Admitting: Cardiovascular Disease

## 2014-05-17 VITALS — BP 140/99 | HR 69 | Ht 68.5 in | Wt 191.0 lb

## 2014-05-17 DIAGNOSIS — I5181 Takotsubo syndrome: Secondary | ICD-10-CM

## 2014-05-17 DIAGNOSIS — F411 Generalized anxiety disorder: Secondary | ICD-10-CM

## 2014-05-17 DIAGNOSIS — I10 Essential (primary) hypertension: Secondary | ICD-10-CM

## 2014-05-17 DIAGNOSIS — F419 Anxiety disorder, unspecified: Secondary | ICD-10-CM

## 2014-05-17 MED ORDER — LISINOPRIL 10 MG PO TABS: 10.0000 mg | ORAL_TABLET | Freq: Every day | ORAL | Status: DC

## 2014-05-17 NOTE — Progress Notes (Signed)
Patient ID: Tara Dickson, female   DOB: 02-21-1961, 53 y.o.   MRN: [id number]      SUBJECTIVE: The patient is here to followup for stress-induced cardiomyopathy and hypertension. Her left ventricular systolic function has since normalized. She denies chest pain and shortness of breath as well as leg swelling. He continues to struggle with anxiety. She is here with her fiance.     No Known Allergies  Current Outpatient Prescriptions  Medication Sig Dispense Refill  . ALPRAZolam (XANAX) 0.5 MG tablet Take 0.5 mg by mouth 3 (three) times daily as needed for anxiety.      Marland Kitchen aspirin 81 MG tablet Take 81 mg by mouth daily.      . carvedilol (COREG) 3.125 MG tablet Take 1 tablet (3.125 mg total) by mouth 2 (two) times daily.  180 tablet  3  . diphenhydrAMINE (SOMINEX) 25 MG tablet Take 25 mg by mouth at bedtime as needed for sleep.      . ergocalciferol (VITAMIN D2) 50000 UNITS capsule Take 50,000 Units by mouth once a week.      . Evening Primrose topical oil Apply topically as needed for dry skin.      Marland Kitchen levETIRAcetam (KEPPRA) 500 MG tablet TAKE (1) TABLET TWICE DAILY.  60 tablet  5  . lisinopril (PRINIVIL,ZESTRIL) 5 MG tablet Take 1 tablet (5 mg total) by mouth daily.  90 tablet  6  . omeprazole (PRILOSEC) 20 MG capsule TAKE 1 CAPSULE BY MOUTH ONCE A DAY.  30 capsule  5  . OVER THE COUNTER MEDICATION Vision formula with lutein      . pravastatin (PRAVACHOL) 40 MG tablet TAKE (1) TABLET BY MOUTH ONCE DAILY.  30 tablet  5  . sertraline (ZOLOFT) 100 MG tablet Take 150 mg by mouth daily. 1 1/2 tab daily       No current facility-administered medications for this visit.    Past Medical History  Diagnosis Date  . Hypertension   . Anxiety   . Depression   . Bipolar 1 disorder   . Melanoma   . Seizures 09/2006  . Osteoarthritis 2007    left knee  . Melanoma 12/2008    Right leg  . Impaired fasting glucose     Past Surgical History  Procedure Laterality Date  . Abdominal  hysterectomy  2006  . Colonoscopy  08/2010    History   Social History  . Marital Status: Single    Spouse Name: N/A    Number of Children: N/A  . Years of Education: N/A   Occupational History  . Not on file.   Social History Main Topics  . Smoking status: Former Smoker    Types: Cigarettes    Quit date: 10/18/1997  . Smokeless tobacco: Not on file  . Alcohol Use: No  . Drug Use: No  . Sexual Activity: Yes    Birth Control/ Protection: None   Other Topics Concern  . Not on file   Social History Narrative  . No narrative on file     Filed Vitals:   05/17/14 1130  BP: 140/99  Pulse: 69  Height: 5' 8.5" (1.74 m)  Weight: 191 lb (86.637 kg)    PHYSICAL EXAM General: NAD Neck: No JVD, no thyromegaly. Lungs: Clear to auscultation bilaterally with normal respiratory effort. CV: Nondisplaced PMI.  Regular rate and rhythm, normal S1/S2, no S3/S4, no murmur. No pretibial or periankle edema.  No carotid bruit.  Normal pedal pulses.  Abdomen: Soft,  nontender, no hepatosplenomegaly, no distention.  Neurologic: Alert and oriented x 3.  Psych: Normal affect. Extremities: No clubbing or cyanosis.   ECG: reviewed and available in electronic records.      ASSESSMENT AND PLAN: 1. Stress-induced cardiomyopathy: Her LV systolic function has since normalized. Her sleep study was reportedly normal. Her BP needs further optimization and I will increase lisinopril to 10 mg daily, especially given her high left ventricular and left atrial pressures.  2. Hypertension: Remains uncontrolled. I will increase lisinopril to 10 mg daily.  3. Anxiety: I suggested she may speak with Dr. Wolfgang Phoenix about referring her for behavioral therapy in [place].  Dispo: f/u 6 months.  Kate Sable, M.D., F.A.C.C.

## 2014-05-17 NOTE — Patient Instructions (Signed)
   Increase Lisinopril to 10mg  daily - may take 2 tabs of your 5mg  daily till finish current supply (new sent to pharm) Continue all other medications.   Your physician wants you to follow up in: 6 months.  You will receive a reminder letter in the mail one-two months in advance.  If you don't receive a letter, please call our office to schedule the follow up appointment

## 2014-05-21 ENCOUNTER — Other Ambulatory Visit: Payer: Self-pay | Admitting: Family Medicine

## 2014-05-30 ENCOUNTER — Telehealth: Payer: Self-pay | Admitting: Family Medicine

## 2014-05-30 NOTE — Telephone Encounter (Signed)
Patient called stating she has no energy,throwing up,cant eat, blood pressure running high,high heart rate was seen in [place] ER on Monday.and she still not feeling any better but cant come in today has no transportation both cars in the shop.

## 2014-05-30 NOTE — Telephone Encounter (Signed)
Left message to return call 

## 2014-05-30 NOTE — Telephone Encounter (Signed)
If the patient is continuing for a left and keep things down she needs to go back to the ER if she has absolutely no transportation EMS take her, certainly we can do followup visit tomorrow morning if things get better and she ends up not having to go to ER

## 2014-05-30 NOTE — Telephone Encounter (Signed)
Left message on voicemail to return call.

## 2014-05-31 NOTE — Telephone Encounter (Signed)
Discussed with patient. Patient stated she was able to keep down some jello and water and wants to see how the day goes and if she is no doing better she will go to Er for evaluation and treatment.

## 2014-08-27 ENCOUNTER — Encounter: Payer: Self-pay | Admitting: Family Medicine

## 2014-08-27 ENCOUNTER — Ambulatory Visit (INDEPENDENT_AMBULATORY_CARE_PROVIDER_SITE_OTHER): Payer: Medicare Other | Admitting: Family Medicine

## 2014-08-27 VITALS — BP 130/86 | Ht 68.5 in | Wt 187.2 lb

## 2014-08-27 DIAGNOSIS — R739 Hyperglycemia, unspecified: Secondary | ICD-10-CM

## 2014-08-27 DIAGNOSIS — I951 Orthostatic hypotension: Secondary | ICD-10-CM

## 2014-08-27 DIAGNOSIS — R7309 Other abnormal glucose: Secondary | ICD-10-CM

## 2014-08-27 DIAGNOSIS — I10 Essential (primary) hypertension: Secondary | ICD-10-CM

## 2014-08-27 DIAGNOSIS — R5383 Other fatigue: Secondary | ICD-10-CM

## 2014-08-27 DIAGNOSIS — R5381 Other malaise: Secondary | ICD-10-CM

## 2014-08-27 DIAGNOSIS — E785 Hyperlipidemia, unspecified: Secondary | ICD-10-CM

## 2014-08-27 NOTE — Progress Notes (Signed)
   Subjective:    Patient ID: Tara Dickson, female    DOB: 04-06-1961, 53 y.o.   MRN: [id number]  HPI Patient arrives for a follow up on back pain. Patient would like to stop seeing neurologist and have [place] manage her seizure disorder. She does have a cardiac history she sees cardiology intermittently. She denies any chest pressure tightness or pain. She takes her medicines as directed. She does state that she has not had lab work done recently for her cholesterol.  Review of Systems She denies chest pain shortness of breath she does relate some lumbar pain she denies sweats chills nausea vomiting.    Objective:   Physical Exam  Lungs are clear heart is regular pulses normal extremities no edema skin warm and dry neurologic grossly normal blood pressure good pulses good subjective discomfort in the lower back neurologic grossly normal      Assessment & Plan:  #1 cardiac-stable continue current measures blood pressure good  #2 hyperlipidemia check lab work continue statin  #3 seizure condition stable maintain medications I did encourage her to see a neurologist at least once per year  #4 patient does have intermittent back pain and discomfort but I don't recommend braces or other items such as that  Patient under a lot of stress see psychiatry I encouraged her to keep those visits  25 minutes the patient carries

## 2014-09-19 LAB — BASIC METABOLIC PANEL
BUN: 6 mg/dL (ref 6–23)
CO2: 30 mEq/L (ref 19–32)
Calcium: 9.3 mg/dL (ref 8.4–10.5)
Chloride: 109 mEq/L (ref 96–112)
Creat: 0.77 mg/dL (ref 0.50–1.10)
Glucose, Bld: 92 mg/dL (ref 70–99)
POTASSIUM: 3.8 meq/L (ref 3.5–5.3)
SODIUM: 144 meq/L (ref 135–145)

## 2014-09-19 LAB — CBC WITH DIFFERENTIAL/PLATELET
Basophils Absolute: 0.1 10*3/uL (ref 0.0–0.1)
Basophils Relative: 1 % (ref 0–1)
EOS PCT: 4 % (ref 0–5)
Eosinophils Absolute: 0.3 10*3/uL (ref 0.0–0.7)
HEMATOCRIT: 33.2 % — AB (ref 36.0–46.0)
Hemoglobin: 10.8 g/dL — ABNORMAL LOW (ref 12.0–15.0)
Lymphocytes Relative: 20 % (ref 12–46)
Lymphs Abs: 1.3 10*3/uL (ref 0.7–4.0)
MCH: 29.9 pg (ref 26.0–34.0)
MCHC: 32.5 g/dL (ref 30.0–36.0)
MCV: 92 fL (ref 78.0–100.0)
MONO ABS: 0.6 10*3/uL (ref 0.1–1.0)
Monocytes Relative: 9 % (ref 3–12)
NEUTROS ABS: 4.2 10*3/uL (ref 1.7–7.7)
Neutrophils Relative %: 66 % (ref 43–77)
Platelets: 139 10*3/uL — ABNORMAL LOW (ref 150–400)
RBC: 3.61 MIL/uL — ABNORMAL LOW (ref 3.87–5.11)
RDW: 13.9 % (ref 11.5–15.5)
WBC: 6.3 10*3/uL (ref 4.0–10.5)

## 2014-09-19 LAB — HEPATIC FUNCTION PANEL
ALT: 31 U/L (ref 0–35)
AST: 31 U/L (ref 0–37)
Albumin: 4.2 g/dL (ref 3.5–5.2)
Alkaline Phosphatase: 85 U/L (ref 39–117)
BILIRUBIN DIRECT: 0.1 mg/dL (ref 0.0–0.3)
BILIRUBIN INDIRECT: 0.3 mg/dL (ref 0.2–1.2)
BILIRUBIN TOTAL: 0.4 mg/dL (ref 0.2–1.2)
Total Protein: 6.8 g/dL (ref 6.0–8.3)

## 2014-09-19 LAB — TSH: TSH: 1.306 u[IU]/mL (ref 0.350–4.500)

## 2014-09-19 LAB — LIPID PANEL
CHOL/HDL RATIO: 4.1 ratio
CHOLESTEROL: 143 mg/dL (ref 0–200)
HDL: 35 mg/dL — AB (ref >39)
LDL Cholesterol: 82 mg/dL (ref 0–99)
TRIGLYCERIDES: 129 mg/dL (ref <150)
VLDL: 26 mg/dL (ref 0–40)

## 2014-09-19 LAB — HEMOGLOBIN A1C
Hgb A1c MFr Bld: 5.9 % — ABNORMAL HIGH (ref <5.7)
MEAN PLASMA GLUCOSE: 123 mg/dL — AB (ref <117)

## 2014-09-24 ENCOUNTER — Encounter: Payer: Self-pay | Admitting: Family Medicine

## 2014-09-24 ENCOUNTER — Ambulatory Visit (INDEPENDENT_AMBULATORY_CARE_PROVIDER_SITE_OTHER): Payer: Medicare Other | Admitting: Family Medicine

## 2014-09-24 VITALS — BP 136/88 | Ht 68.5 in | Wt 186.0 lb

## 2014-09-24 DIAGNOSIS — E538 Deficiency of other specified B group vitamins: Secondary | ICD-10-CM

## 2014-09-24 DIAGNOSIS — G8929 Other chronic pain: Secondary | ICD-10-CM

## 2014-09-24 DIAGNOSIS — M545 Low back pain, unspecified: Secondary | ICD-10-CM

## 2014-09-24 DIAGNOSIS — D509 Iron deficiency anemia, unspecified: Secondary | ICD-10-CM

## 2014-09-24 DIAGNOSIS — Z23 Encounter for immunization: Secondary | ICD-10-CM

## 2014-09-24 MED ORDER — HYDROCODONE-ACETAMINOPHEN 5-325 MG PO TABS: 1.0000 | ORAL_TABLET | Freq: Four times a day (QID) | ORAL | Status: DC | PRN

## 2014-09-24 NOTE — Progress Notes (Signed)
   Subjective:    Patient ID: Tara Dickson, female    DOB: 10/01/1961, 53 y.o.   MRN: [id number]  Hyperlipidemia This is a chronic problem. Pertinent negatives include no chest pain or shortness of breath. There are no compliance problems.   walks 20 mins a day. Eats healthy.  Had bloodwork on 09/18/14 Back pain. Insurance did not approve lidoderm patch. Would like to discuss sonething for back pain.  Flu vaccine today.   Patient denies any chest tightness pressure pain shortness breath denies any skin growths denies loss of appetite fever chills sweats Review of Systems  Constitutional: Negative for activity change, appetite change and fatigue.  HENT: Negative for congestion.   Respiratory: Negative for shortness of breath.   Cardiovascular: Negative for chest pain.  Gastrointestinal: Negative for abdominal pain.  Endocrine: Negative for polydipsia and polyphagia.  Genitourinary: Negative for frequency.  Neurological: Negative for weakness.  Psychiatric/Behavioral: Negative for confusion.       Objective:   Physical Exam  Vitals reviewed. Constitutional: She appears well-nourished. No distress.  Cardiovascular: Normal rate, regular rhythm and normal heart sounds.   No murmur heard. Pulmonary/Chest: Effort normal and breath sounds normal. No respiratory distress.  Musculoskeletal: She exhibits no edema.  Lymphadenopathy:    She has no cervical adenopathy.  Neurological: She is alert. She exhibits normal muscle tone.  Psychiatric: Her behavior is normal.          Assessment & Plan:  #1 hyperlipidemia-cholesterol medicine continue. Check lab work every 6 months watch diet closely #2 history of melanoma-her legs arms back and abdomen were no sign of reoccurrence. No lumps felt. Monitor again in 6 months time #3 anxiety/depression followed through with psychiatry #4 chronic back pain repeat x-rays stretching exercises strengthening exercises she'll hydrocodone for  sparing use #5 HTN decent control overall watch diet continue medication #6 I am concerned about her anemia would this needs further testing await the results may need referral to gastroenterology for another scope. She had colonoscopy in 2011. Greater than 25 minutes the patient discussing all these multiple issues

## 2014-10-05 LAB — FERRITIN: FERRITIN: 81 ng/mL (ref 10–291)

## 2014-10-05 LAB — IRON AND TIBC
%SAT: 19 % — AB (ref 20–55)
Iron: 48 ug/dL (ref 42–145)
TIBC: 248 ug/dL — AB (ref 250–470)
UIBC: 200 ug/dL (ref 125–400)

## 2014-10-05 LAB — VITAMIN B12: VITAMIN B 12: 477 pg/mL (ref 211–911)

## 2014-10-08 ENCOUNTER — Encounter: Payer: Self-pay | Admitting: Internal Medicine

## 2014-10-08 ENCOUNTER — Other Ambulatory Visit: Payer: Self-pay

## 2014-10-08 DIAGNOSIS — E611 Iron deficiency: Secondary | ICD-10-CM

## 2014-10-28 ENCOUNTER — Ambulatory Visit (INDEPENDENT_AMBULATORY_CARE_PROVIDER_SITE_OTHER): Payer: Medicare Other | Admitting: Cardiovascular Disease

## 2014-10-28 ENCOUNTER — Encounter: Payer: Self-pay | Admitting: Cardiovascular Disease

## 2014-10-28 VITALS — BP 129/79 | HR 62 | Ht 66.5 in | Wt 182.0 lb

## 2014-10-28 DIAGNOSIS — I5181 Takotsubo syndrome: Secondary | ICD-10-CM

## 2014-10-28 DIAGNOSIS — Z136 Encounter for screening for cardiovascular disorders: Secondary | ICD-10-CM

## 2014-10-28 DIAGNOSIS — F419 Anxiety disorder, unspecified: Secondary | ICD-10-CM

## 2014-10-28 DIAGNOSIS — I10 Essential (primary) hypertension: Secondary | ICD-10-CM

## 2014-10-28 NOTE — Patient Instructions (Signed)
Continue all current medications. Your physician wants you to follow up in:  1 year.  You will receive a reminder letter in the mail one-two months in advance.  If you don't receive a letter, please call our office to schedule the follow up appointment   

## 2014-10-28 NOTE — Progress Notes (Signed)
Patient ID: Tara Dickson, female   DOB: 1961-07-17, 53 y.o.   MRN: [id number]      SUBJECTIVE: The patient is here to followup for stress-induced cardiomyopathy and hypertension. Her left ventricular systolic function has since normalized. She may get some mild chest pains only during times of significant anxiety, and denies shortness of breath as well as leg swelling.    Review of Systems: As per "subjective", otherwise negative.  No Known Allergies  Current Outpatient Prescriptions  Medication Sig Dispense Refill  . aspirin 81 MG tablet Take 81 mg by mouth daily.    . carvedilol (COREG) 3.125 MG tablet Take 1 tablet (3.125 mg total) by mouth 2 (two) times daily. 180 tablet 3  . diazepam (VALIUM) 5 MG tablet Take 5 mg by mouth 2 (two) times daily as needed for anxiety.    . ergocalciferol (VITAMIN D2) 50000 UNITS capsule Take 50,000 Units by mouth once a week.    . Evening Primrose topical oil Apply topically as needed for dry skin.    Marland Kitchen HYDROcodone-acetaminophen (NORCO/VICODIN) 5-325 MG per tablet Take 1 tablet by mouth every 6 (six) hours as needed. 30 tablet 0  . levETIRAcetam (KEPPRA) 500 MG tablet TAKE (1) TABLET TWICE DAILY. 60 tablet 5  . lisinopril (PRINIVIL,ZESTRIL) 10 MG tablet Take 1 tablet (10 mg total) by mouth daily. 30 tablet 6  . omeprazole (PRILOSEC) 20 MG capsule TAKE 1 CAPSULE BY MOUTH ONCE A DAY. 30 capsule 5  . pravastatin (PRAVACHOL) 40 MG tablet TAKE (1) TABLET BY MOUTH ONCE DAILY. 30 tablet 5  . sertraline (ZOLOFT) 100 MG tablet Take 150 mg by mouth daily. 1 1/2 tab daily     No current facility-administered medications for this visit.    Past Medical History  Diagnosis Date  . Hypertension   . Anxiety   . Depression   . Bipolar 1 disorder   . Melanoma   . Seizures 09/2006  . Osteoarthritis 2007    left knee  . Melanoma 12/2008    Right leg  . Impaired fasting glucose     Past Surgical History  Procedure Laterality Date  . Abdominal  hysterectomy  2006  . Colonoscopy  08/2010    History   Social History  . Marital Status: Single    Spouse Name: N/A    Number of Children: N/A  . Years of Education: N/A   Occupational History  . Not on file.   Social History Main Topics  . Smoking status: Former Smoker    Types: Cigarettes    Start date: 08/28/1973    Quit date: 10/18/1997  . Smokeless tobacco: Not on file  . Alcohol Use: No  . Drug Use: No  . Sexual Activity: Yes    Birth Control/ Protection: None   Other Topics Concern  . Not on file   Social History Narrative     Filed Vitals:   10/28/14 1358  BP: 129/79  Pulse: 62  Height: 5' 6.5" (1.689 m)  Weight: 182 lb (82.555 kg)  SpO2: 100%    PHYSICAL EXAM General: NAD HEENT: Normal. Neck: No JVD, no thyromegaly. Lungs: Clear to auscultation bilaterally with normal respiratory effort. CV: Nondisplaced PMI.  Regular rate and rhythm, normal S1/S2, no S3/S4, no murmur. No pretibial or periankle edema.  No carotid bruit.  Normal pedal pulses.  Abdomen: Soft, nontender, no hepatosplenomegaly, no distention.  Neurologic: Alert and oriented x 3.  Psych: Normal affect. Skin: Normal. Musculoskeletal: Normal range of motion,  no gross deformities. Extremities: No clubbing or cyanosis.   ECG: Most recent ECG reviewed.      ASSESSMENT AND PLAN: 1. Stress-induced cardiomyopathy: Her LV systolic function has since normalized and she is asymptomatic. No changes to therapy. 2. Essential hypertension: Well controlled on current therapy. No changes.  Dispo: f/u 1 year.  Kate Sable, M.D., F.A.C.C.

## 2014-11-01 ENCOUNTER — Other Ambulatory Visit: Payer: Self-pay | Admitting: Cardiovascular Disease

## 2014-11-01 ENCOUNTER — Other Ambulatory Visit: Payer: Self-pay | Admitting: Family Medicine

## 2014-11-11 ENCOUNTER — Ambulatory Visit: Payer: Medicare Other | Admitting: Gastroenterology

## 2014-11-28 ENCOUNTER — Encounter (HOSPITAL_COMMUNITY): Payer: Self-pay | Admitting: Interventional Cardiology

## 2014-12-02 ENCOUNTER — Other Ambulatory Visit: Payer: Self-pay | Admitting: Cardiovascular Disease

## 2014-12-02 ENCOUNTER — Other Ambulatory Visit: Payer: Self-pay | Admitting: Family Medicine

## 2014-12-19 ENCOUNTER — Other Ambulatory Visit: Payer: Self-pay | Admitting: Family Medicine

## 2014-12-30 ENCOUNTER — Encounter: Payer: Self-pay | Admitting: Gastroenterology

## 2014-12-30 ENCOUNTER — Ambulatory Visit (INDEPENDENT_AMBULATORY_CARE_PROVIDER_SITE_OTHER): Payer: Medicare Other | Admitting: Gastroenterology

## 2014-12-30 VITALS — BP 142/95 | HR 75 | Temp 98.4°F | Ht 68.0 in | Wt 188.6 lb

## 2014-12-30 DIAGNOSIS — D649 Anemia, unspecified: Secondary | ICD-10-CM | POA: Insufficient documentation

## 2014-12-30 LAB — CBC WITH DIFFERENTIAL/PLATELET
BASOS ABS: 0 10*3/uL (ref 0.0–0.1)
BASOS PCT: 0 % (ref 0–1)
Eosinophils Absolute: 0.4 10*3/uL (ref 0.0–0.7)
Eosinophils Relative: 7 % — ABNORMAL HIGH (ref 0–5)
HCT: 34.6 % — ABNORMAL LOW (ref 36.0–46.0)
Hemoglobin: 11.3 g/dL — ABNORMAL LOW (ref 12.0–15.0)
LYMPHS PCT: 20 % (ref 12–46)
Lymphs Abs: 1.3 10*3/uL (ref 0.7–4.0)
MCH: 29.6 pg (ref 26.0–34.0)
MCHC: 32.7 g/dL (ref 30.0–36.0)
MCV: 90.6 fL (ref 78.0–100.0)
MONO ABS: 0.6 10*3/uL (ref 0.1–1.0)
MONOS PCT: 9 % (ref 3–12)
NEUTROS ABS: 4 10*3/uL (ref 1.7–7.7)
Neutrophils Relative %: 64 % (ref 43–77)
Platelets: 174 10*3/uL (ref 150–400)
RBC: 3.82 MIL/uL — AB (ref 3.87–5.11)
RDW: 14.1 % (ref 11.5–15.5)
WBC: 6.3 10*3/uL (ref 4.0–10.5)

## 2014-12-30 LAB — IRON AND TIBC
%SAT: 21 % (ref 20–55)
Iron: 62 ug/dL (ref 42–145)
TIBC: 299 ug/dL (ref 250–470)
UIBC: 237 ug/dL (ref 125–400)

## 2014-12-30 LAB — FERRITIN: FERRITIN: 73 ng/mL (ref 10–291)

## 2014-12-30 NOTE — Assessment & Plan Note (Addendum)
54 year old lady normocytic anemia with mildly depressed iron saturations with normal ferritin and low normal serum iron, low TIBC. Hemoglobin slightly lower since her hospitalization follow-up 2014. Denies overt GI bleeding. Hemoccult status unknown. Last colonoscopy 2011, unremarkable. Uses limited Aleve on occasion for headache. Aspirin 81 mg daily. May be dealing with anemia of chronic disease plus or minus trending towards iron deficiency. Patient has a strict diet may not be obtaining sufficient iron in her diet.  First and foremost, we will update her labs as last was available for over 3 months old. Check I FOBT. Further recommendations to follow.

## 2014-12-30 NOTE — Progress Notes (Signed)
Primary Care Physician:  Sallee Lange, MD  Primary Gastroenterologist:  Garfield Cornea, MD   Chief Complaint  Patient presents with  . Anemia    HPI:  Tara Dickson is a 54 y.o. female here for further evaluation of normocytic anemia, with slightly low iron saturation. Hemoccult status unknown.  Patient seen back in 2011 for normocytic anemia. Repeatedly heme negative at that time. Colonoscopy essentially unremarkable as outlined below. Back in the time she was also taking Excedrin and Naprosyn at least weekly. H/O Cdiff and erosive reflux esophagitis as well.   No constipation, diarrhea, melena, brbpr. Denies abdominal pain. No unintentional weight loss. Very strict with her diet. essentially no red meats. Good appetite good. Heartburn controlled on prilosec. No dysphagia. No vomiting. Aleve once every 2 weeks.   Reports inadequate conscious sedation in the past.  Current Outpatient Prescriptions  Medication Sig Dispense Refill  . aspirin 81 MG tablet Take 81 mg by mouth daily.    . carvedilol (COREG) 3.125 MG tablet TAKE (1) TABLET TWICE DAILY. 60 tablet 6  . diazepam (VALIUM) 5 MG tablet Take 5 mg by mouth 2 (two) times daily as needed for anxiety.    . ergocalciferol (VITAMIN D2) 50000 UNITS capsule Take 50,000 Units by mouth once a week.    . Evening Primrose topical oil Apply topically as needed for dry skin.    Marland Kitchen HYDROcodone-acetaminophen (NORCO/VICODIN) 5-325 MG per tablet Take 1 tablet by mouth every 6 (six) hours as needed. 30 tablet 0  . hydrOXYzine (ATARAX/VISTARIL) 25 MG tablet     . levETIRAcetam (KEPPRA) 500 MG tablet TAKE (1) TABLET TWICE DAILY. 60 tablet 3  . lisinopril (PRINIVIL,ZESTRIL) 10 MG tablet Take 1 tablet (10 mg total) by mouth daily. 30 tablet 6  . omeprazole (PRILOSEC) 20 MG capsule TAKE 1 CAPSULE BY MOUTH ONCE A DAY. 30 capsule 3  . pravastatin (PRAVACHOL) 40 MG tablet TAKE (1) TABLET BY MOUTH ONCE DAILY. 30 tablet 5  . sertraline (ZOLOFT) 100 MG tablet  Take 150 mg by mouth daily. 1 1/2 tab daily     No current facility-administered medications for this visit.    Allergies as of 12/30/2014  . (No Known Allergies)    Past Medical History  Diagnosis Date  . Hypertension   . Anxiety   . Depression   . Bipolar 1 disorder   . Melanoma   . Seizures 09/2006  . Osteoarthritis 2007    left knee  . Melanoma 12/2008    Right leg  . Impaired fasting glucose     Past Surgical History  Procedure Laterality Date  . Abdominal hysterectomy  2006  . Colonoscopy  08/2010    Dr. Gala Romney: Single hemorrhoidal tag, otherwise normal.  . Left and right heart catheterization with coronary angiogram N/A 10/18/2013    Procedure: LEFT AND RIGHT HEART CATHETERIZATION WITH CORONARY ANGIOGRAM;  Surgeon: Sinclair Grooms, MD;  Location: [facility] CATH LAB;  Service: Cardiovascular;  Laterality: N/A;  . Melanoma surgery    . Esophagogastroduodenoscopy  2005?    erosive reflux esophagitis per medical history  . Colonoscopy  2005?    C.diff per medical reports    Family History  Problem Relation Age of Onset  . Hypertension Mother   . Hypertension Father   . Colon cancer Neg Hx     History   Social History  . Marital Status: Single    Spouse Name: N/A    Number of Children: N/A  . Years of  Education: N/A   Occupational History  . Not on file.   Social History Main Topics  . Smoking status: Former Smoker    Types: Cigarettes    Start date: 08/28/1973    Quit date: 10/18/1997  . Smokeless tobacco: Not on file  . Alcohol Use: No  . Drug Use: No  . Sexual Activity: Yes    Birth Control/ Protection: None   Other Topics Concern  . Not on file   Social History Narrative      ROS:  General: Negative for anorexia, weight loss, fever, chills, fatigue, weakness. Eyes: Negative for vision changes.  ENT: Negative for hoarseness, difficulty swallowing , nasal congestion. CV: Negative for chest pain, angina, palpitations, dyspnea on exertion,  peripheral edema.  Respiratory: Negative for dyspnea at rest, dyspnea on exertion, cough, sputum, wheezing.  GI: See history of present illness. GU:  Negative for dysuria, hematuria, urinary incontinence, urinary frequency, nocturnal urination.  MS: Negative for joint pain, low back pain.  Derm: Negative for rash or itching.  Neuro: Negative for weakness, abnormal sensation, seizure, frequent headaches, memory loss, confusion.  Psych: Negative for anxiety, depression, suicidal ideation, hallucinations.  Endo: Negative for unusual weight change.  Heme: Negative for bruising or bleeding. Allergy: Negative for rash or hives.    Physical Examination:  BP 142/95 mmHg  Pulse 75  Temp(Src) 98.4 F (36.9 C) (Oral)  Ht 5\' 8"  (1.727 m)  Wt 188 lb 9.6 oz (85.548 kg)  BMI 28.68 kg/m2   General: Well-nourished, well-developed in no acute distress.  Head: Normocephalic, atraumatic.   Eyes: Conjunctiva pink, no icterus. Mouth: Oropharyngeal mucosa moist and pink , no lesions erythema or exudate. Neck: Supple without thyromegaly, masses, or lymphadenopathy.  Lungs: Clear to auscultation bilaterally.  Heart: Regular rate and rhythm, no murmurs rubs or gallops.  Abdomen: Bowel sounds are normal, nontender, nondistended, no hepatosplenomegaly or masses, no abdominal bruits or    hernia , no rebound or guarding.   Rectal: not performed Extremities: No lower extremity edema. No clubbing or deformities.  Neuro: Alert and oriented x 4 , grossly normal neurologically.  Skin: Warm and dry, no rash or jaundice.   Psych: Alert and cooperative, normal mood and affect.  Labs: Lab Results  Component Value Date   IRON 48 10/04/2014   TIBC 248* 10/04/2014   FERRITIN 81 10/04/2014   Lab Results  Component Value Date   UKGURKYH06 237 10/04/2014   No results found for: FOLATE Lab Results  Component Value Date   WBC 6.3 09/18/2014   HGB 10.8* 09/18/2014   HCT 33.2* 09/18/2014   MCV 92.0 09/18/2014    PLT 139* 09/18/2014   Lab Results  Component Value Date   TSH 1.306 09/18/2014   Lab Results  Component Value Date   ALT 31 09/18/2014   AST 31 09/18/2014   ALKPHOS 85 09/18/2014   BILITOT 0.4 09/18/2014   Lab Results  Component Value Date   HGBA1C 5.9* 09/18/2014     Imaging Studies: No results found.

## 2014-12-30 NOTE — Patient Instructions (Signed)
1. Please have your labs done and collect stool specimen and return to our office.

## 2014-12-31 NOTE — Progress Notes (Signed)
cc'ed to pcp °

## 2015-01-03 NOTE — Progress Notes (Signed)
Quick Note:  Hgb stable but slightly low. Iron studies normal. Await ifobt ______

## 2015-01-16 ENCOUNTER — Ambulatory Visit (INDEPENDENT_AMBULATORY_CARE_PROVIDER_SITE_OTHER): Payer: Medicare Other

## 2015-01-16 DIAGNOSIS — D649 Anemia, unspecified: Secondary | ICD-10-CM | POA: Diagnosis not present

## 2015-01-16 LAB — IFOBT (OCCULT BLOOD): IMMUNOLOGICAL FECAL OCCULT BLOOD TEST: POSITIVE

## 2015-01-16 NOTE — Progress Notes (Signed)
Pt returned ifobt and it was positive 

## 2015-01-17 NOTE — Progress Notes (Signed)
Quick Note:  Her Ifobt was positive. Given her anemia and heme + stool, she needs TCS +/- EGD with Dr. Gala Romney in the OR (inadequate conscious sedation before and polypharmacy). ______

## 2015-01-21 ENCOUNTER — Telehealth: Payer: Self-pay | Admitting: Family Medicine

## 2015-01-21 ENCOUNTER — Other Ambulatory Visit: Payer: Self-pay | Admitting: Family Medicine

## 2015-01-21 MED ORDER — HYDROCODONE-ACETAMINOPHEN 5-325 MG PO TABS: 1.0000 | ORAL_TABLET | Freq: Four times a day (QID) | ORAL | Status: DC | PRN

## 2015-01-21 NOTE — Telephone Encounter (Signed)
Patient requesting Rx for hydrocodone 5 mg

## 2015-01-21 NOTE — Telephone Encounter (Signed)
Give rx for 20 tablets , OV within next 3 to 4 weeks

## 2015-01-21 NOTE — Telephone Encounter (Signed)
3 month's is up! Last seen 09/24/14

## 2015-01-22 ENCOUNTER — Other Ambulatory Visit: Payer: Self-pay | Admitting: Family Medicine

## 2015-01-22 NOTE — Telephone Encounter (Signed)
Patient notified that script is ready for pickup. Transferred to front desk to schedule appointment.

## 2015-01-23 NOTE — Progress Notes (Signed)
CALLED PATIENT WITH APPOINTMENT, SHE IS AWARE OF DATE AND TIME

## 2015-02-14 ENCOUNTER — Other Ambulatory Visit: Payer: Self-pay

## 2015-02-14 ENCOUNTER — Ambulatory Visit (INDEPENDENT_AMBULATORY_CARE_PROVIDER_SITE_OTHER): Payer: Medicare Other | Admitting: Family Medicine

## 2015-02-14 ENCOUNTER — Ambulatory Visit (INDEPENDENT_AMBULATORY_CARE_PROVIDER_SITE_OTHER): Payer: Medicare Other | Admitting: Gastroenterology

## 2015-02-14 ENCOUNTER — Encounter: Payer: Self-pay | Admitting: Family Medicine

## 2015-02-14 ENCOUNTER — Encounter: Payer: Self-pay | Admitting: Gastroenterology

## 2015-02-14 VITALS — BP 130/84 | Ht 68.5 in | Wt 185.2 lb

## 2015-02-14 VITALS — BP 143/80 | HR 48 | Temp 97.0°F | Ht 68.0 in | Wt 185.4 lb

## 2015-02-14 DIAGNOSIS — F5000 Anorexia nervosa, unspecified: Secondary | ICD-10-CM | POA: Insufficient documentation

## 2015-02-14 DIAGNOSIS — K5900 Constipation, unspecified: Secondary | ICD-10-CM | POA: Diagnosis not present

## 2015-02-14 DIAGNOSIS — R195 Other fecal abnormalities: Secondary | ICD-10-CM

## 2015-02-14 DIAGNOSIS — R7303 Prediabetes: Secondary | ICD-10-CM

## 2015-02-14 DIAGNOSIS — D649 Anemia, unspecified: Secondary | ICD-10-CM

## 2015-02-14 DIAGNOSIS — Z205 Contact with and (suspected) exposure to viral hepatitis: Secondary | ICD-10-CM | POA: Diagnosis not present

## 2015-02-14 DIAGNOSIS — R634 Abnormal weight loss: Secondary | ICD-10-CM | POA: Diagnosis not present

## 2015-02-14 DIAGNOSIS — R7309 Other abnormal glucose: Secondary | ICD-10-CM

## 2015-02-14 DIAGNOSIS — E785 Hyperlipidemia, unspecified: Secondary | ICD-10-CM | POA: Diagnosis not present

## 2015-02-14 DIAGNOSIS — F329 Major depressive disorder, single episode, unspecified: Secondary | ICD-10-CM

## 2015-02-14 DIAGNOSIS — F32A Depression, unspecified: Secondary | ICD-10-CM

## 2015-02-14 MED ORDER — LINACLOTIDE 145 MCG PO CAPS: 145.0000 ug | ORAL_CAPSULE | Freq: Every day | ORAL | Status: DC

## 2015-02-14 MED ORDER — HYDROCODONE-ACETAMINOPHEN 5-325 MG PO TABS: 1.0000 | ORAL_TABLET | Freq: Two times a day (BID) | ORAL | Status: DC | PRN

## 2015-02-14 MED ORDER — CLORAZEPATE DIPOTASSIUM 3.75 MG PO TABS: 3.7500 mg | ORAL_TABLET | Freq: Two times a day (BID) | ORAL | Status: DC | PRN

## 2015-02-14 MED ORDER — SERTRALINE HCL 100 MG PO TABS: 200.0000 mg | ORAL_TABLET | Freq: Every day | ORAL | Status: DC

## 2015-02-14 MED ORDER — PEG-KCL-NACL-NASULF-NA ASC-C 100 G PO SOLR: 1.0000 | ORAL | Status: DC

## 2015-02-14 NOTE — Progress Notes (Signed)
Primary Care Physician:  Sallee Lange, MD  Primary Gastroenterologist:  Garfield Cornea, MD   Chief Complaint  Patient presents with  . OTHER    Schedule TCS and EGD    HPI:  Tara Dickson is a 54 y.o. female here to schedule colonoscopy plus or minus EGD to further evaluate anemia, Hemoccult-positive stool. Last seen January 2016. History of normocytic anemia, previously was slightly low iron saturation, low normal serum iron, low TIBC. Labs were repeated last month in her hemoglobin still low at 11.3 although her serum iron was improved. Her iron saturations remained low normal, ferritin normal. Cannot exclude a trend towards iron deficiency anemia plus anemia of chronic disease. Her last colonoscopy was in 2011, essentially unremarkable.  BM about 3 per week at times, but sometimes daily. Tries to control with diet. No laxatives. No melena, brbpr. No abdominal pain. No heartburn on PPI. If misses PPI, then bad heartburn. No dysphagia. No vomiting. Lost 20 pounds last spring but fairly stable since then. States she has no appetite.   Significant other has Hepatitis C. Last checked >5 years ago.  Current Outpatient Prescriptions  Medication Sig Dispense Refill  . aspirin 81 MG tablet Take 81 mg by mouth daily.    . carvedilol (COREG) 3.125 MG tablet TAKE (1) TABLET TWICE DAILY. 60 tablet 6  . diazepam (VALIUM) 5 MG tablet Take 5 mg by mouth 2 (two) times daily as needed for anxiety. Taking only one tablet daily in the AM    . ergocalciferol (VITAMIN D2) 50000 UNITS capsule Take 50,000 Units by mouth once a week.    . Evening Primrose topical oil Apply topically as needed for dry skin.    Marland Kitchen levETIRAcetam (KEPPRA) 500 MG tablet TAKE (1) TABLET TWICE DAILY. 60 tablet 3  . lisinopril (PRINIVIL,ZESTRIL) 10 MG tablet Take 1 tablet (10 mg total) by mouth daily. (Patient taking differently: Take 5 mg by mouth daily. ) 30 tablet 6  . omeprazole (PRILOSEC) 20 MG capsule TAKE 1 CAPSULE BY MOUTH  ONCE A DAY. 30 capsule 3  . pravastatin (PRAVACHOL) 40 MG tablet TAKE (1) TABLET BY MOUTH ONCE DAILY. 30 tablet 5  . sertraline (ZOLOFT) 100 MG tablet Take 100 mg by mouth daily. Takes 2 100 mg tablets in the Am     No current facility-administered medications for this visit.    Allergies as of 02/14/2015  . (No Known Allergies)    Past Medical History  Diagnosis Date  . Hypertension   . Anxiety   . Depression   . Bipolar 1 disorder   . Seizures 09/2006  . Osteoarthritis 2007    left knee  . Melanoma 12/2008    Right leg  . Impaired fasting glucose   . Cardiomyopathy, nonischemic 2014    Cardiomyopathy nonischemic possible Takasubo syndrome    Past Surgical History  Procedure Laterality Date  . Abdominal hysterectomy  2006  . Colonoscopy  08/2010    Dr. Gala Romney: Single hemorrhoidal tag, otherwise normal.  . Left and right heart catheterization with coronary angiogram N/A 10/18/2013    Procedure: LEFT AND RIGHT HEART CATHETERIZATION WITH CORONARY ANGIOGRAM;  Surgeon: Sinclair Grooms, MD;  Location: [facility] CATH LAB;  Service: Cardiovascular;  Laterality: N/A;  . Melanoma surgery    . Esophagogastroduodenoscopy  2005?    erosive reflux esophagitis per medical history  . Colonoscopy  2005?    C.diff per medical reports    Family History  Problem Relation Age of Onset  .  Hypertension Mother   . Hypertension Father   . Colon cancer Neg Hx     History   Social History  . Marital Status: Single    Spouse Name: N/A  . Number of Children: N/A  . Years of Education: N/A   Occupational History  . Not on file.   Social History Main Topics  . Smoking status: Former Smoker    Types: Cigarettes    Start date: 08/28/1973    Quit date: 10/18/1997  . Smokeless tobacco: Not on file     Comment: Quit smoking x 15 years  . Alcohol Use: No  . Drug Use: No  . Sexual Activity: Yes    Birth Control/ Protection: None   Other Topics Concern  . Not on file   Social History  Narrative      ROS:  General: +for anorexia, weight loss. No fever, chills, fatigue, weakness. Eyes: Negative for vision changes.  ENT: Negative for hoarseness, difficulty swallowing , nasal congestion. CV: Negative for chest pain, angina, palpitations, dyspnea on exertion, peripheral edema.  Respiratory: Negative for dyspnea at rest, dyspnea on exertion, cough, sputum, wheezing.  GI: See history of present illness. GU:  Negative for dysuria, hematuria, urinary incontinence, urinary frequency, nocturnal urination.  MS: +chronic for joint pain. no low back pain.  Derm: Negative for rash or itching.  Neuro: Negative for weakness, abnormal sensation, seizure, frequent headaches, memory loss, confusion.  Psych: Negative for anxiety, depression, suicidal ideation, hallucinations.  Endo: Negative for unusual weight change.  Heme: Negative for bruising or bleeding. Allergy: Negative for rash or hives.    Physical Examination:  BP 143/80 mmHg  Pulse 48  Temp(Src) 97 F (36.1 C) (Oral)  Ht 5\' 8"  (1.727 m)  Wt 185 lb 6.4 oz (84.097 kg)  BMI 28.20 kg/m2   General: Well-nourished, well-developed in no acute distress.  Head: Normocephalic, atraumatic.   Eyes: Conjunctiva pink, no icterus. Mouth: Oropharyngeal mucosa moist and pink , no lesions erythema or exudate. Neck: Supple without thyromegaly, masses, or lymphadenopathy.  Lungs: Clear to auscultation bilaterally.  Heart: Regular rate and rhythm, no murmurs rubs or gallops.  Abdomen: Bowel sounds are normal, nontender, nondistended, no hepatosplenomegaly or masses, no abdominal bruits or    hernia , no rebound or guarding.   Rectal: not performed Extremities: No lower extremity edema. No clubbing or deformities.  Neuro: Alert and oriented x 4 , grossly normal neurologically.  Skin: Warm and dry, no rash or jaundice.   Psych: Alert and cooperative, normal mood and affect.  Labs: Lab Results  Component Value Date   WBC 6.3  12/30/2014   HGB 11.3* 12/30/2014   HCT 34.6* 12/30/2014   MCV 90.6 12/30/2014   PLT 174 12/30/2014   Lab Results  Component Value Date   CREATININE 0.77 09/18/2014   BUN 6 09/18/2014   NA 144 09/18/2014   K 3.8 09/18/2014   CL 109 09/18/2014   CO2 30 09/18/2014   Lab Results  Component Value Date   ALT 31 09/18/2014   AST 31 09/18/2014   ALKPHOS 85 09/18/2014   BILITOT 0.4 09/18/2014   Lab Results  Component Value Date   IRON 62 12/30/2014   TIBC 299 12/30/2014   FERRITIN 73 12/30/2014     Imaging Studies: No results found.

## 2015-02-14 NOTE — Assessment & Plan Note (Signed)
Intermittent constipation. Patient controls with diet. Prior to colonoscopy, take linzess 128mcg daily for 10 days to improve bowel prep. Samples provided.

## 2015-02-14 NOTE — Assessment & Plan Note (Signed)
54 y/o female with normocytic anemia with mildly depressed iron sat and serum iron but normal ferritin. Noted since 2014 hospitalization. Recently heme +. Heartburn well controlled on PPI. Intermittent constipation. +anorexia and weight loss last year of over 20 pounds although seems to have stabilized for the most part since last fall.   Recommend colonoscopy +/- EGD with Dr. Gala Romney with deep sedation given prior h/o inadequate conscious sedation.  I have discussed the risks, alternatives, benefits with regards to but not limited to the risk of reaction to medication, bleeding, infection, perforation and the patient is agreeable to proceed. Written consent to be obtained.

## 2015-02-14 NOTE — Assessment & Plan Note (Addendum)
Patient's significant other for 15 years with HCV. She was last checked greater than 5 years ago. Recommend checking at any time. She will let me know when she is ready.

## 2015-02-14 NOTE — Patient Instructions (Signed)

## 2015-02-14 NOTE — Patient Instructions (Signed)
1. Colonoscopy and possible upper endoscopy with Dr. Gala Romney. See separate instructions. 2. Start Linzess once daily on empty stomach 10 days before your colonoscopy. 3. Please consider being checked for hepatitis C. When you are ready, let me know and we can do the labs.

## 2015-02-14 NOTE — Progress Notes (Signed)
   Subjective:    Patient ID: Tara Dickson, female    DOB: 1961-09-25, 54 y.o.   MRN: [id number]  HPI  .This patient was seen today for chronic pain  The medication list was reviewed and updated.   -Compliance with pain medication: yes  The patient was advised the importance of maintaining medication and not using illegal substances with these.  Refills needed: yes  The patient was educated that we can provide 3 monthly scripts for their medication, it is their responsibility to follow the instructions.  Side effects or complications from medications: none  Patient is aware that pain medications are meant to minimize the severity of the pain to allow their pain levels to improve to allow for better function. They are aware of that pain medications cannot totally remove their pain.  Due for UDT ( at least once per year) : next time  Long discussion held regarding her slight weight loss plus also following through with the specialists for further evaluation of this.  She also has depression and anxiety. Her current psychologist according to the patient does not spend much time with her whatsoever. As a result it really doesn't sound like they're doing much for her. Patient was told as long as her condition is stable we will follow that here. She denies being suicidal or homicidal     Review of Systems  Constitutional: Negative for activity change, appetite change and fatigue.  HENT: Negative for congestion.   Respiratory: Negative for cough.   Cardiovascular: Negative for chest pain.  Gastrointestinal: Negative for abdominal pain.  Endocrine: Negative for polydipsia and polyphagia.  Neurological: Negative for weakness.  Psychiatric/Behavioral: Negative for confusion.       Objective:   Physical Exam  Constitutional: She appears well-nourished. No distress.  Cardiovascular: Normal rate, regular rhythm and normal heart sounds.   No murmur heard. Pulmonary/Chest: Effort  normal and breath sounds normal. No respiratory distress.  Musculoskeletal: She exhibits no edema.  Lymphadenopathy:    She has no cervical adenopathy.  Neurological: She is alert. She exhibits normal muscle tone.  Psychiatric: Her behavior is normal.  Vitals reviewed.         Assessment & Plan:  Has had some weight loss but recently the weight is stable. She is going to have endoscopy done in the near future. She follow-up with [place] within 3 months if still having weight loss I would recommend that we have her see oncology for follow-up she does have a remote history of melanoma.  Chronic pain discomfort she denies abusing the medication. She states she'll uses it when absolutely necessary. 3 separate prescriptions given these should last her over the course of the next 90 days she ought to follow-up at the end of those 90 days for further evaluation. Patient understands the importance of doing so.  Patient does use marijuana I told her that officially I would not recommend for her to use it my personal feeling says that she is more likely to continue using it because she finds it helps [place] herself. She has been advised if she ever feels drowsy not to operate any dangerous equipment  Depression and anxiety Tranxene prescribed patient to follow-up if ongoing troubles antidepressant prescribed

## 2015-02-17 NOTE — Progress Notes (Signed)
cc'ed to pcp °

## 2015-02-21 ENCOUNTER — Other Ambulatory Visit: Payer: Self-pay | Admitting: Cardiovascular Disease

## 2015-02-24 ENCOUNTER — Other Ambulatory Visit: Payer: Self-pay | Admitting: Cardiovascular Disease

## 2015-02-24 ENCOUNTER — Encounter (HOSPITAL_COMMUNITY)
Admission: RE | Admit: 2015-02-24 | Discharge: 2015-02-24 | Disposition: A | Discharge status: Home / Self Care | Payer: Medicare Other | Source: Ambulatory Visit | Attending: Internal Medicine | Admitting: Internal Medicine

## 2015-02-24 ENCOUNTER — Encounter (HOSPITAL_COMMUNITY): Payer: Self-pay

## 2015-02-24 DIAGNOSIS — I10 Essential (primary) hypertension: Secondary | ICD-10-CM | POA: Diagnosis not present

## 2015-02-24 DIAGNOSIS — F419 Anxiety disorder, unspecified: Secondary | ICD-10-CM | POA: Diagnosis not present

## 2015-02-24 DIAGNOSIS — D649 Anemia, unspecified: Secondary | ICD-10-CM | POA: Diagnosis not present

## 2015-02-24 DIAGNOSIS — K210 Gastro-esophageal reflux disease with esophagitis: Secondary | ICD-10-CM | POA: Diagnosis not present

## 2015-02-24 DIAGNOSIS — Z7982 Long term (current) use of aspirin: Secondary | ICD-10-CM | POA: Diagnosis not present

## 2015-02-24 DIAGNOSIS — K219 Gastro-esophageal reflux disease without esophagitis: Secondary | ICD-10-CM | POA: Diagnosis not present

## 2015-02-24 DIAGNOSIS — R634 Abnormal weight loss: Secondary | ICD-10-CM | POA: Diagnosis present

## 2015-02-24 DIAGNOSIS — M179 Osteoarthritis of knee, unspecified: Secondary | ICD-10-CM | POA: Diagnosis not present

## 2015-02-24 DIAGNOSIS — R630 Anorexia: Secondary | ICD-10-CM | POA: Diagnosis present

## 2015-02-24 DIAGNOSIS — F319 Bipolar disorder, unspecified: Secondary | ICD-10-CM | POA: Diagnosis not present

## 2015-02-24 DIAGNOSIS — Z6828 Body mass index (BMI) 28.0-28.9, adult: Secondary | ICD-10-CM | POA: Diagnosis not present

## 2015-02-24 DIAGNOSIS — K5730 Diverticulosis of large intestine without perforation or abscess without bleeding: Secondary | ICD-10-CM | POA: Diagnosis not present

## 2015-02-24 DIAGNOSIS — D123 Benign neoplasm of transverse colon: Secondary | ICD-10-CM | POA: Diagnosis not present

## 2015-02-24 DIAGNOSIS — Z79899 Other long term (current) drug therapy: Secondary | ICD-10-CM | POA: Diagnosis not present

## 2015-02-24 DIAGNOSIS — M797 Fibromyalgia: Secondary | ICD-10-CM | POA: Diagnosis not present

## 2015-02-24 DIAGNOSIS — Z85820 Personal history of malignant melanoma of skin: Secondary | ICD-10-CM | POA: Diagnosis not present

## 2015-02-24 DIAGNOSIS — R195 Other fecal abnormalities: Secondary | ICD-10-CM | POA: Diagnosis present

## 2015-02-24 DIAGNOSIS — I252 Old myocardial infarction: Secondary | ICD-10-CM | POA: Diagnosis not present

## 2015-02-24 DIAGNOSIS — Z87891 Personal history of nicotine dependence: Secondary | ICD-10-CM | POA: Diagnosis not present

## 2015-02-24 HISTORY — DX: Gastro-esophageal reflux disease without esophagitis: K21.9

## 2015-02-24 HISTORY — DX: Acute myocardial infarction, unspecified: I21.9

## 2015-02-24 HISTORY — DX: Dorsalgia, unspecified: M54.9

## 2015-02-24 HISTORY — DX: Fibromyalgia: M79.7

## 2015-02-24 HISTORY — DX: Pure hypercholesterolemia, unspecified: E78.00

## 2015-02-24 HISTORY — DX: Other chronic pain: G89.29

## 2015-02-24 LAB — CBC
HEMATOCRIT: 33.6 % — AB (ref 36.0–46.0)
Hemoglobin: 10.7 g/dL — ABNORMAL LOW (ref 12.0–15.0)
MCH: 29.3 pg (ref 26.0–34.0)
MCHC: 31.8 g/dL (ref 30.0–36.0)
MCV: 92.1 fL (ref 78.0–100.0)
Platelets: 158 10*3/uL (ref 150–400)
RBC: 3.65 MIL/uL — AB (ref 3.87–5.11)
RDW: 13.9 % (ref 11.5–15.5)
WBC: 5.6 10*3/uL (ref 4.0–10.5)

## 2015-02-24 LAB — BASIC METABOLIC PANEL
Anion gap: 6 (ref 5–15)
BUN: 10 mg/dL (ref 6–23)
CALCIUM: 9.4 mg/dL (ref 8.4–10.5)
CO2: 29 mmol/L (ref 19–32)
Chloride: 107 mmol/L (ref 96–112)
Creatinine, Ser: 0.94 mg/dL (ref 0.50–1.10)
GFR calc Af Amer: 78 mL/min — ABNORMAL LOW (ref >90)
GFR calc non Af Amer: 68 mL/min — ABNORMAL LOW (ref >90)
GLUCOSE: 75 mg/dL (ref 70–99)
Potassium: 4.1 mmol/L (ref 3.5–5.1)
Sodium: 142 mmol/L (ref 135–145)

## 2015-02-24 MED ORDER — LISINOPRIL 10 MG PO TABS: ORAL_TABLET | ORAL | Status: DC

## 2015-02-24 NOTE — Telephone Encounter (Signed)
Received fax refill request  Rx # [id number] Medication:  Lisinopril 10 mg tablet Qty 30 Sig:  Take one tablet by mouth once daily Physician:  Bronson Ing

## 2015-02-24 NOTE — Patient Instructions (Signed)
Tara Dickson  02/24/2015   Your procedure is scheduled on:  02/27/2015  Report to [facility] at  845  AM.  Call this number if you have problems the morning of surgery: [phone]   Remember:   Do not eat food or drink liquids after midnight.   Take these medicines the morning of surgery with A SIP OF WATER:  Coreg, tranxene, hydrocodone, keppra, lisinopril, prilosec, zoloft   Do not wear jewelry, make-up or nail polish.  Do not wear lotions, powders, or perfumes.   Do not shave 48 hours prior to surgery. Men may shave face and neck.  Do not bring valuables to the hospital.  [facility] is not responsible for any belongings or valuables.               Contacts, dentures or bridgework may not be worn into surgery.  Leave suitcase in the car. After surgery it may be brought to your room.  For patients admitted to the hospital, discharge time is determined by your treatment team.               Patients discharged the day of surgery will not be allowed to drive home.  Name and phone number of your driver: family  Special Instructions: N/A   Please read over the following fact sheets that you were given: Pain Booklet, Coughing and Deep Breathing, Surgical Site Infection Prevention, Anesthesia Post-op Instructions and Care and Recovery After Surgery Esophagogastroduodenoscopy Esophagogastroduodenoscopy (EGD) is a procedure to examine the lining of the esophagus, stomach, and first part of the small intestine (duodenum). A long, flexible, lighted tube with a camera attached (endoscope) is inserted down the throat to view these organs. This procedure is done to detect problems or abnormalities, such as inflammation, bleeding, ulcers, or growths, in order to treat them. The procedure lasts about 5-20 minutes. It is usually an outpatient procedure, but it may need to be performed in emergency cases in the hospital. LET YOUR CAREGIVER KNOW ABOUT:   Allergies to food or  medicine.  All medicines you are taking, including vitamins, herbs, eyedrops, and over-the-counter medicines and creams.  Use of steroids (by mouth or creams).  Previous problems you or members of your family have had with the use of anesthetics.  Any blood disorders you have.  Previous surgeries you have had.  Other health problems you have.  Possibility of pregnancy, if this applies. RISKS AND COMPLICATIONS  Generally, EGD is a safe procedure. However, as with any procedure, complications can occur. Possible complications include:  Infection.  Bleeding.  Tearing (perforation) of the esophagus, stomach, or duodenum.  Difficulty breathing or not being able to breath.  Excessive sweating.  Spasms of the larynx.  Slowed heartbeat.  Low blood pressure. BEFORE THE PROCEDURE  Do not eat or drink anything for 6-8 hours before the procedure or as directed by your caregiver.  Ask your caregiver about changing or stopping your regular medicines.  If you wear dentures, be prepared to remove them before the procedure.  Arrange for someone to drive you home after the procedure. PROCEDURE   A vein will be accessed to give medicines and fluids. A medicine to relax you (sedative) and a pain reliever will be given through that access into the vein.  A numbing medicine (local anesthetic) may be sprayed on your throat for comfort and to stop you from gagging or coughing.  A mouth guard may be placed in  your mouth to protect your teeth and to keep you from biting on the endoscope.  You will be asked to lie on your left side.  The endoscope is inserted down your throat and into the esophagus, stomach, and duodenum.  Air is put through the endoscope to allow your caregiver to view the lining of your esophagus clearly.  The esophagus, stomach, and duodenum is then examined. During the exam, your caregiver may:  Remove tissue to be examined under a microscope (biopsy) for  inflammation, infection, or other medical problems.  Remove growths.  Remove objects (foreign bodies) that are stuck.  Treat any bleeding with medicines or other devices that stop tissues from bleeding (hot cautery, clipping devices).  Widen (dilate) or stretch narrowed areas of the esophagus and stomach.  The endoscope will then be withdrawn. AFTER THE PROCEDURE  You will be taken to a recovery area to be monitored. You will be able to go home once you are stable and alert.  Do not eat or drink anything until the local anesthetic and numbing medicines have worn off. You may choke.  It is normal to feel bloated, have pain with swallowing, or have a sore throat for a short time. This will wear off.  Your caregiver should be able to discuss his or her findings with you. It will take longer to discuss the test results if any biopsies were taken. Document Released: 04/08/2005 Document Revised: 04/22/2014 Document Reviewed: 11/08/2012 [facility] Patient Information 2015 [place], [place]. This information is not intended to replace advice given to you by your health care provider. Make sure you discuss any questions you have with your health care provider. Colonoscopy A colonoscopy is an exam to look at the entire large intestine (colon). This exam can help find problems such as tumors, polyps, inflammation, and areas of bleeding. The exam takes about 1 hour.  LET [facility] CARE PROVIDER KNOW ABOUT:   Any allergies you have.  All medicines you are taking, including vitamins, herbs, eye drops, creams, and over-the-counter medicines.  Previous problems you or members of your family have had with the use of anesthetics.  Any blood disorders you have.  Previous surgeries you have had.  Medical conditions you have. RISKS AND COMPLICATIONS  Generally, this is a safe procedure. However, as with any procedure, complications can occur. Possible complications include:  Bleeding.  Tearing or  rupture of the colon wall.  Reaction to medicines given during the exam.  Infection (rare). BEFORE THE PROCEDURE   Ask your health care provider about changing or stopping your regular medicines.  You may be prescribed an oral bowel prep. This involves drinking a large amount of medicated liquid, starting the day before your procedure. The liquid will cause you to have multiple loose stools until your stool is almost clear or light green. This cleans out your colon in preparation for the procedure.  Do not eat or drink anything else once you have started the bowel prep, unless your health care provider tells you it is safe to do so.  Arrange for someone to drive you home after the procedure. PROCEDURE   You will be given medicine to help you relax (sedative).  You will lie on your side with your knees bent.  A long, flexible tube with a light and camera on the end (colonoscope) will be inserted through the rectum and into the colon. The camera sends video back to a computer screen as it moves through the colon. The colonoscope also  releases carbon dioxide gas to inflate the colon. This helps your health care provider see the area better.  During the exam, your health care provider may take a small tissue sample (biopsy) to be examined under a microscope if any abnormalities are found.  The exam is finished when the entire colon has been viewed. AFTER THE PROCEDURE   Do not drive for 24 hours after the exam.  You may have a small amount of blood in your stool.  You may pass moderate amounts of gas and have mild abdominal cramping or bloating. This is caused by the gas used to inflate your colon during the exam.  Ask when your test results will be ready and how you will get your results. Make sure you get your test results. Document Released: 12/03/2000 Document Revised: 09/26/2013 Document Reviewed: 08/13/2013 [facility] Patient Information 2015 [place], [place]. This information is  not intended to replace advice given to you by your health care provider. Make sure you discuss any questions you have with your health care provider. PATIENT INSTRUCTIONS POST-ANESTHESIA  IMMEDIATELY FOLLOWING SURGERY:  Do not drive or operate machinery for the first twenty four hours after surgery.  Do not make any important decisions for twenty four hours after surgery or while taking narcotic pain medications or sedatives.  If you develop intractable nausea and vomiting or a severe headache please notify your doctor immediately.  FOLLOW-UP:  Please make an appointment with your surgeon as instructed. You do not need to follow up with anesthesia unless specifically instructed to do so.  WOUND CARE INSTRUCTIONS (if applicable):  Keep a dry clean dressing on the anesthesia/puncture wound site if there is drainage.  Once the wound has quit draining you may leave it open to air.  Generally you should leave the bandage intact for twenty four hours unless there is drainage.  If the epidural site drains for more than 36-48 hours please call the anesthesia department.  QUESTIONS?:  Please feel free to call your physician or the hospital operator if you have any questions, and they will be happy to assist you.

## 2015-02-24 NOTE — Telephone Encounter (Signed)
escribed refill per request 

## 2015-02-27 ENCOUNTER — Ambulatory Visit (HOSPITAL_COMMUNITY)
Admission: RE | Admit: 2015-02-27 | Discharge: 2015-02-27 | Disposition: A | Discharge status: Home / Self Care | Payer: Medicare Other | Source: Ambulatory Visit | Attending: Internal Medicine | Admitting: Internal Medicine

## 2015-02-27 ENCOUNTER — Encounter (HOSPITAL_COMMUNITY)
Admission: RE | Disposition: A | Discharge status: Home / Self Care | Payer: Self-pay | Source: Ambulatory Visit | Attending: Internal Medicine

## 2015-02-27 ENCOUNTER — Ambulatory Visit (HOSPITAL_COMMUNITY): Payer: Medicare Other | Admitting: Anesthesiology

## 2015-02-27 ENCOUNTER — Encounter (HOSPITAL_COMMUNITY): Payer: Self-pay | Admitting: *Deleted

## 2015-02-27 DIAGNOSIS — Z85820 Personal history of malignant melanoma of skin: Secondary | ICD-10-CM | POA: Diagnosis not present

## 2015-02-27 DIAGNOSIS — Z79899 Other long term (current) drug therapy: Secondary | ICD-10-CM | POA: Insufficient documentation

## 2015-02-27 DIAGNOSIS — I252 Old myocardial infarction: Secondary | ICD-10-CM | POA: Insufficient documentation

## 2015-02-27 DIAGNOSIS — M797 Fibromyalgia: Secondary | ICD-10-CM | POA: Insufficient documentation

## 2015-02-27 DIAGNOSIS — R195 Other fecal abnormalities: Secondary | ICD-10-CM | POA: Diagnosis not present

## 2015-02-27 DIAGNOSIS — M179 Osteoarthritis of knee, unspecified: Secondary | ICD-10-CM | POA: Diagnosis not present

## 2015-02-27 DIAGNOSIS — D123 Benign neoplasm of transverse colon: Secondary | ICD-10-CM | POA: Insufficient documentation

## 2015-02-27 DIAGNOSIS — K5730 Diverticulosis of large intestine without perforation or abscess without bleeding: Secondary | ICD-10-CM | POA: Diagnosis not present

## 2015-02-27 DIAGNOSIS — D649 Anemia, unspecified: Secondary | ICD-10-CM | POA: Insufficient documentation

## 2015-02-27 DIAGNOSIS — F319 Bipolar disorder, unspecified: Secondary | ICD-10-CM | POA: Insufficient documentation

## 2015-02-27 DIAGNOSIS — Z7982 Long term (current) use of aspirin: Secondary | ICD-10-CM | POA: Insufficient documentation

## 2015-02-27 DIAGNOSIS — K2100 Gastro-esophageal reflux disease with esophagitis, without bleeding: Secondary | ICD-10-CM | POA: Insufficient documentation

## 2015-02-27 DIAGNOSIS — Z860100 Personal history of colon polyps, unspecified: Secondary | ICD-10-CM | POA: Insufficient documentation

## 2015-02-27 DIAGNOSIS — Z6828 Body mass index (BMI) 28.0-28.9, adult: Secondary | ICD-10-CM | POA: Insufficient documentation

## 2015-02-27 DIAGNOSIS — I10 Essential (primary) hypertension: Secondary | ICD-10-CM | POA: Insufficient documentation

## 2015-02-27 DIAGNOSIS — Z87891 Personal history of nicotine dependence: Secondary | ICD-10-CM | POA: Insufficient documentation

## 2015-02-27 DIAGNOSIS — F419 Anxiety disorder, unspecified: Secondary | ICD-10-CM | POA: Insufficient documentation

## 2015-02-27 DIAGNOSIS — K219 Gastro-esophageal reflux disease without esophagitis: Secondary | ICD-10-CM | POA: Diagnosis not present

## 2015-02-27 HISTORY — PX: COLONOSCOPY WITH PROPOFOL: SHX5780

## 2015-02-27 HISTORY — PX: ESOPHAGOGASTRODUODENOSCOPY (EGD) WITH PROPOFOL: SHX5813

## 2015-02-27 HISTORY — PX: POLYPECTOMY: SHX5525

## 2015-02-27 SURGERY — COLONOSCOPY WITH PROPOFOL
Anesthesia: Monitor Anesthesia Care

## 2015-02-27 MED ORDER — FENTANYL CITRATE 0.05 MG/ML IJ SOLN
INTRAMUSCULAR | Status: AC
  Filled 2015-02-27: qty 2

## 2015-02-27 MED ORDER — STERILE WATER FOR IRRIGATION IR SOLN
Status: DC | PRN
  Administered 2015-02-27: 1000 mL

## 2015-02-27 MED ORDER — ONDANSETRON HCL 4 MG/2ML IJ SOLN
4.0000 mg | Freq: Once | INTRAMUSCULAR | Status: AC
  Administered 2015-02-27: 4 mg via INTRAVENOUS

## 2015-02-27 MED ORDER — ONDANSETRON HCL 4 MG/2ML IJ SOLN: 4.0000 mg | Freq: Once | INTRAMUSCULAR | Status: DC | PRN

## 2015-02-27 MED ORDER — MIDAZOLAM HCL 2 MG/2ML IJ SOLN
INTRAMUSCULAR | Status: AC
  Filled 2015-02-27: qty 2

## 2015-02-27 MED ORDER — MIDAZOLAM HCL 2 MG/2ML IJ SOLN
1.0000 mg | INTRAMUSCULAR | Status: AC | PRN
  Administered 2015-02-27: 2 mg via INTRAVENOUS
  Administered 2015-02-27 (×2): 1 mg via INTRAVENOUS

## 2015-02-27 MED ORDER — ONDANSETRON HCL 4 MG/2ML IJ SOLN
INTRAMUSCULAR | Status: AC
  Filled 2015-02-27: qty 2

## 2015-02-27 MED ORDER — LIDOCAINE VISCOUS 2 % MT SOLN
OROMUCOSAL | Status: AC
  Administered 2015-02-27: 3 mL
  Filled 2015-02-27: qty 15

## 2015-02-27 MED ORDER — MIDAZOLAM HCL 5 MG/5ML IJ SOLN
INTRAMUSCULAR | Status: DC | PRN
  Administered 2015-02-27: 2 mg via INTRAVENOUS

## 2015-02-27 MED ORDER — PROPOFOL INFUSION 10 MG/ML OPTIME
INTRAVENOUS | Status: DC | PRN
  Administered 2015-02-27: 100 ug/kg/min via INTRAVENOUS
  Administered 2015-02-27: 12:00:00 via INTRAVENOUS

## 2015-02-27 MED ORDER — WATER FOR IRRIGATION, STERILE IR SOLN
Status: DC | PRN
  Administered 2015-02-27: 1000 mL

## 2015-02-27 MED ORDER — FENTANYL CITRATE 0.05 MG/ML IJ SOLN: 25.0000 ug | INTRAMUSCULAR | Status: DC | PRN

## 2015-02-27 MED ORDER — PROPOFOL 10 MG/ML IV BOLUS
INTRAVENOUS | Status: DC | PRN
  Administered 2015-02-27: 10 mg via INTRAVENOUS

## 2015-02-27 MED ORDER — GLYCOPYRROLATE 0.2 MG/ML IJ SOLN
0.2000 mg | Freq: Once | INTRAMUSCULAR | Status: AC
  Administered 2015-02-27: 0.2 mg via INTRAVENOUS

## 2015-02-27 MED ORDER — GLYCOPYRROLATE 0.2 MG/ML IJ SOLN
INTRAMUSCULAR | Status: AC
  Filled 2015-02-27: qty 1

## 2015-02-27 MED ORDER — LACTATED RINGERS IV SOLN
INTRAVENOUS | Status: DC
  Administered 2015-02-27: 09:00:00 via INTRAVENOUS

## 2015-02-27 MED ORDER — LIDOCAINE VISCOUS 2 % MT SOLN
15.0000 mL | Freq: Once | OROMUCOSAL | Status: AC
  Administered 2015-02-27: 3 mL via OROMUCOSAL

## 2015-02-27 MED ORDER — FENTANYL CITRATE 0.05 MG/ML IJ SOLN
25.0000 ug | INTRAMUSCULAR | Status: AC
  Administered 2015-02-27 (×2): 25 ug via INTRAVENOUS

## 2015-02-27 MED ORDER — PROPOFOL 10 MG/ML IV BOLUS
INTRAVENOUS | Status: AC
  Filled 2015-02-27: qty 20

## 2015-02-27 SURGICAL SUPPLY — 10 items
BLOCK BITE 60FR ADLT L/F BLUE (MISCELLANEOUS) ×4 IMPLANT
FORMALIN 10 PREFIL 20ML (MISCELLANEOUS) ×4 IMPLANT
KIT CLEAN ENDO COMPLIANCE (KITS) ×4 IMPLANT
LUBRICANT JELLY 4.5OZ STERILE (MISCELLANEOUS) ×4 IMPLANT
MANIFOLD NEPTUNE II (INSTRUMENTS) ×4 IMPLANT
SNARE ROTATE MED OVAL 20MM (MISCELLANEOUS) ×4 IMPLANT
SYR 50ML LL SCALE MARK (SYRINGE) ×4 IMPLANT
TRAP SPECIMEN MUCOUS 40CC (MISCELLANEOUS) ×4 IMPLANT
TUBING IRRIGATION ENDOGATOR (MISCELLANEOUS) ×4 IMPLANT
WATER STERILE IRR 1000ML POUR (IV SOLUTION) ×4 IMPLANT

## 2015-02-27 NOTE — Op Note (Signed)
[facility] [address] [place], [address]   COLONOSCOPY PROCEDURE REPORT  PATIENT: Tara Dickson, Tara Dickson  MR#: [id number] BIRTHDATE: 07/06/1961 , 66  yrs. old GENDER: female ENDOSCOPIST: R.  Garfield Cornea, MD FACP [facility] REFERRED ME:QASTM Wolfgang Phoenix, M.D. PROCEDURE DATE:  2015/03/26 PROCEDURE:   Colonoscopy with snare polypectomy INDICATIONS:Hemoccult-positive stool. MEDICATIONS: Deep sedation parotid Mickel Fuchs ASA CLASS:       Class II  CONSENT: The risks, benefits, alternatives and imponderables including but not limited to bleeding, perforation as well as the possibility of a missed lesion have been reviewed.  The potential for biopsy, lesion removal, etc. have also been discussed. Questions have been answered.  All parties agreeable.  Please see the history and physical in the medical record for more information.  DESCRIPTION OF PROCEDURE:   After the risks benefits and alternatives of the procedure were thoroughly explained, informed consent was obtained.  The digital rectal exam      The endoscope was introduced through the anus and advanced to the cecum, which was identified by both the appendix and ileocecal valve. No adverse events experienced.   The quality of the prep was adequate  The instrument was then slowly withdrawn as the colon was fully examined.      COLON FINDINGS: Normal-appearing rectal mucosa.  Scattered sigmoid diverticula; (2) 5-7 mm polyps at the hepatic flexure; otherwise, the remainder of the colonic mucosa  Normal.  The above-mentioned polyps were cold snare removed and recovered for the pathologist.  Retroflexed views revealed no abnormalities. .  Withdrawal time=7 minutes 0 seconds.  The scope was withdrawn and the procedure completed. COMPLICATIONS: There were no immediate complications.  ENDOSCOPIC IMPRESSION: Colonic diverticulosis. Colonic polyps?"removed as described above  RECOMMENDATIONS: Follow up on  pathology. See EGD report.  eSigned:  R. Garfield Cornea, MD Rosalita Chessman [facility] Mar 26, 2015 11:56 AM   cc:  CPT CODES: ICD CODES:  The ICD and CPT codes recommended by this software are interpretations from the data that the clinical staff has captured with the software.  The verification of the translation of this report to the ICD and CPT codes and modifiers is the sole responsibility of the health care institution and practicing physician where this report was generated.  [employer] will not be held responsible for the validity of the ICD and CPT codes included on this report.  AMA assumes no liability for data contained or not contained herein. CPT is a Designer, television/film set of the [employer].

## 2015-02-27 NOTE — Anesthesia Preprocedure Evaluation (Signed)
Anesthesia Evaluation  Patient identified by MRN, date of birth, ID band Patient awake    Reviewed: Allergy & Precautions, NPO status , Patient's Chart, lab work & pertinent test results, reviewed documented beta blocker date and time   Airway Mallampati: I  TM Distance: >3 FB     Dental  (+) Poor Dentition, Chipped, Missing, Dental Advisory Given   Pulmonary resolved, former smoker,  breath sounds clear to auscultation        Cardiovascular hypertension, Pt. on medications and Pt. on home beta blockers + Past MI Rhythm:Regular Rate:Normal  Cardiomyopathy, nonischemic possible Takasubo syndrome   Neuro/Psych Seizures -,  PSYCHIATRIC DISORDERS Anxiety Depression Bipolar Disorder    GI/Hepatic GERD-  ,  Endo/Other  Morbid obesity  Renal/GU      Musculoskeletal  (+) Fibromyalgia -  Abdominal   Peds  Hematology  (+) anemia ,   Anesthesia Other Findings   Reproductive/Obstetrics                             Anesthesia Physical Anesthesia Plan  ASA: III  Anesthesia Plan: MAC   Post-op Pain Management:    Induction: Intravenous  Airway Management Planned: Simple Face Mask  Additional Equipment:   Intra-op Plan:   Post-operative Plan:   Informed Consent: I have reviewed the patients History and Physical, chart, labs and discussed the procedure including the risks, benefits and alternatives for the proposed anesthesia with the patient or authorized representative who has indicated his/her understanding and acceptance.     Plan Discussed with:   Anesthesia Plan Comments:         Anesthesia Quick Evaluation

## 2015-02-27 NOTE — Interval H&P Note (Signed)
History and Physical Interval Note:  02/27/2015 11:04 AM  Tara Dickson  has presented today for surgery, with the diagnosis of ANEMIA/WT LOSS/CONSTIPATION/HEME POSITIVE STOOLS  The various methods of treatment have been discussed with the patient and family. After consideration of risks, benefits and other options for treatment, the patient has consented to  Procedure(s): COLONOSCOPY WITH PROPOFOL (N/A) ESOPHAGOGASTRODUODENOSCOPY (EGD) WITH PROPOFOL (N/A) as a surgical intervention .  The patient's history has been reviewed, patient examined, no change in status, stable for surgery.  I have reviewed the patient's chart and labs.  Questions were answered to the patient's satisfaction.     Manus Rudd  Patient without change. With anorexia and weight loss Hemoccult-positive stool will proceed with both EGD and colonoscopy.The risks, benefits, limitations, imponderables and alternatives regarding both EGD and colonoscopy have been reviewed with the patient. Questions have been answered. All parties agreeable.

## 2015-02-27 NOTE — Op Note (Signed)
[facility] [address] [place], [address]   ENDOSCOPY PROCEDURE REPORT  PATIENT: Tara Dickson, Tara Dickson  MR#: [id number] BIRTHDATE: [birth year] , 32  yrs. old GENDER: female ENDOSCOPIST: R.  Garfield Cornea, MD FACP FACG REFERRED BY:  Sallee Lange, M.D. PROCEDURE DATE:  03-04-2015 PROCEDURE:  EGD, diagnostic INDICATIONS:  Anorexia; weight loss. MEDICATIONS: deep sedation per Dr.  [facility] ASA CLASS:      Class II  CONSENT: The risks, benefits, limitations, alternatives and imponderables have been discussed.  The potential for biopsy, esophogeal dilation, etc. have also been reviewed.  Questions have been answered.  All parties agreeable.  Please see the history and physical in the medical record for more information.  DESCRIPTION OF PROCEDURE: After the risks benefits and alternatives of the procedure were thoroughly explained, informed consent was obtained.  The    endoscope was introduced through the mouth and advanced to the second portion of the duodenum , limited by Without limitations. The instrument was slowly withdrawn as the mucosa was fully examined.    Tiny distal esophageal erosions straddling the GE junction.  No Barrett's esophagus or other esophageal abnormality.  Stomach is empty.  Normal-appearing gastric mucosa.  Patent pylorus. Normal-appearing first and second portion of the duodenum. Retroflexed views revealed no abnormalities.     The scope was then withdrawn from the patient and the procedure completed.  COMPLICATIONS: There were no immediate complications.  ENDOSCOPIC IMPRESSION: Mild erosive reflux esophagitis; otherwise normal EGD  RECOMMENDATIONS: Increase Prilosec to 40 mg daily. See colonoscopy report.  REPEAT EXAM:  eSigned:  R. Garfield Cornea, MD Rosalita Chessman [facility] 04-Mar-2015 11:32 AM    CC:  CPT CODES: ICD CODES:  The ICD and CPT codes recommended by this software are interpretations from the data that the  clinical staff has captured with the software.  The verification of the translation of this report to the ICD and CPT codes and modifiers is the sole responsibility of the health care institution and practicing physician where this report was generated.  [employer] will not be held responsible for the validity of the ICD and CPT codes included on this report.  AMA assumes no liability for data contained or not contained herein. CPT is a Designer, television/film set of the [employer].  PATIENT NAME:  [name], [name] MR#: [id number]

## 2015-02-27 NOTE — Transfer of Care (Signed)
Immediate Anesthesia Transfer of Care Note  Patient: Tara Dickson  Procedure(s) Performed: Procedure(s): COLONOSCOPY WITH PROPOFOL At cecum at 1144; total withdrawal time=89minutes (N/A) ESOPHAGOGASTRODUODENOSCOPY (EGD) WITH PROPOFOL (N/A) POLYPECTOMY  Patient Location: PACU  Anesthesia Type:MAC  Level of Consciousness: awake, alert  and oriented  Airway & Oxygen Therapy: Patient Spontanous Breathing  Post-op Assessment: Report given to RN  Post vital signs: Reviewed  Last Vitals:  Filed Vitals:   02/27/15 1100  BP: 129/87  Pulse:   Temp:   Resp: 17    Complications: No apparent anesthesia complications

## 2015-02-27 NOTE — Discharge Instructions (Addendum)
Colonoscopy Discharge Instructions  Read the instructions outlined below and refer to this sheet in the next few weeks. These discharge instructions provide you with general information on caring for yourself after you leave the hospital. Your doctor may also give you specific instructions. While your treatment has been planned according to the most current medical practices available, unavoidable complications occasionally occur. If you have any problems or questions after discharge, call Dr. Gala Romney at [phone]. ACTIVITY  You may resume your regular activity, but move at a slower pace for the next 24 hours.   Take frequent rest periods for the next 24 hours.   Walking will help get rid of the air and reduce the bloated feeling in your belly (abdomen).   No driving for 24 hours (because of the medicine (anesthesia) used during the test).    Do not sign any important legal documents or operate any machinery for 24 hours (because of the anesthesia used during the test).  NUTRITION  Drink plenty of fluids.   You may resume your normal diet as instructed by your doctor.   Begin with a light meal and progress to your normal diet. Heavy or fried foods are harder to digest and may make you feel sick to your stomach (nauseated).   Avoid alcoholic beverages for 24 hours or as instructed.  MEDICATIONS  You may resume your normal medications unless your doctor tells you otherwise.  WHAT YOU CAN EXPECT TODAY  Some feelings of bloating in the abdomen.   Passage of more gas than usual.   Spotting of blood in your stool or on the toilet paper.  IF YOU HAD POLYPS REMOVED DURING THE COLONOSCOPY:  No aspirin products for 7 days or as instructed.   No alcohol for 7 days or as instructed.   Eat a soft diet for the next 24 hours.  FINDING OUT THE RESULTS OF YOUR TEST Not all test results are available during your visit. If your test results are not back during the visit, make an appointment  with your caregiver to find out the results. Do not assume everything is normal if you have not heard from your caregiver or the medical facility. It is important for you to follow up on all of your test results.  SEEK IMMEDIATE MEDICAL ATTENTION IF:  You have more than a spotting of blood in your stool.   Your belly is swollen (abdominal distention).   You are nauseated or vomiting.   You have a temperature over 101.  You have abdominal pain or discomfort that is severe or gets worse throughout the day. EGD Discharge instructions Please read the instructions outlined below and refer to this sheet in the next few weeks. These discharge instructions provide you with general information on caring for yourself after you leave the hospital. Your doctor may also give you specific instructions. While your treatment has been planned according to the most current medical practices available, unavoidable complications occasionally occur. If you have any problems or questions after discharge, please call your doctor. ACTIVITY You may resume your regular activity but move at a slower pace for the next 24 hours.  Take frequent rest periods for the next 24 hours.  Walking will help expel (get rid of) the air and reduce the bloated feeling in your abdomen.  No driving for 24 hours (because of the anesthesia (medicine) used during the test).  You may shower.  Do not sign any important legal documents or operate any machinery for 24  hours (because of the anesthesia used during the test).  NUTRITION Drink plenty of fluids.  You may resume your normal diet.  Begin with a light meal and progress to your normal diet.  Avoid alcoholic beverages for 24 hours or as instructed by your caregiver.  MEDICATIONS You may resume your normal medications unless your caregiver tells you otherwise.  WHAT YOU CAN EXPECT TODAY You may experience abdominal discomfort such as a feeling of fullness or gas pains.   FOLLOW-UP Your doctor will discuss the results of your test with you.  SEEK IMMEDIATE MEDICAL ATTENTION IF ANY OF THE FOLLOWING OCCUR: Excessive nausea (feeling sick to your stomach) and/or vomiting.  Severe abdominal pain and distention (swelling).  Trouble swallowing.  Temperature over 101 F (37.8 C).  Rectal bleeding or vomiting of blood.    GERD, polyp, diverticulosis information provided  Increase Prilosec to 40 mg daily  Further recommendations to follow pending review of pathology report    Gastroesophageal Reflux Disease, Adult Gastroesophageal reflux disease (GERD) happens when acid from your stomach flows up into the esophagus. When acid comes in contact with the esophagus, the acid causes soreness (inflammation) in the esophagus. Over time, GERD may create small holes (ulcers) in the lining of the esophagus. CAUSES   Increased body weight. This puts pressure on the stomach, making acid rise from the stomach into the esophagus.  Smoking. This increases acid production in the stomach.  Drinking alcohol. This causes decreased pressure in the lower esophageal sphincter (valve or ring of muscle between the esophagus and stomach), allowing acid from the stomach into the esophagus.  Late evening meals and a full stomach. This increases pressure and acid production in the stomach.  A malformed lower esophageal sphincter. Sometimes, no cause is found. SYMPTOMS   Burning pain in the lower part of the mid-chest behind the breastbone and in the mid-stomach area. This may occur twice a week or more often.  Trouble swallowing.  Sore throat.  Dry cough.  Asthma-like symptoms including chest tightness, shortness of breath, or wheezing. DIAGNOSIS  Your caregiver may be able to diagnose GERD based on your symptoms. In some cases, X-rays and other tests may be done to check for complications or to check the condition of your stomach and esophagus. TREATMENT  Your caregiver  may recommend over-the-counter or prescription medicines to help decrease acid production. Ask your caregiver before starting or adding any new medicines.  HOME CARE INSTRUCTIONS   Change the factors that you can control. Ask your caregiver for guidance concerning weight loss, quitting smoking, and alcohol consumption.  Avoid foods and drinks that make your symptoms worse, such as:  Caffeine or alcoholic drinks.  Chocolate.  Peppermint or mint flavorings.  Garlic and onions.  Spicy foods.  Citrus fruits, such as oranges, lemons, or limes.  Tomato-based foods such as sauce, chili, salsa, and pizza.  Fried and fatty foods.  Avoid lying down for the 3 hours prior to your bedtime or prior to taking a nap.  Eat small, frequent meals instead of large meals.  Wear loose-fitting clothing. Do not wear anything tight around your waist that causes pressure on your stomach.  Raise the head of your bed 6 to 8 inches with wood blocks to help you sleep. Extra pillows will not help.  Only take over-the-counter or prescription medicines for pain, discomfort, or fever as directed by your caregiver.  Do not take aspirin, ibuprofen, or other nonsteroidal anti-inflammatory drugs (NSAIDs). SEEK IMMEDIATE MEDICAL CARE IF:  You have pain in your arms, neck, jaw, teeth, or back.  Your pain increases or changes in intensity or duration.  You develop nausea, vomiting, or sweating (diaphoresis).  You develop shortness of breath, or you faint.  Your vomit is green, yellow, black, or looks like coffee grounds or blood.  Your stool is red, bloody, or black. These symptoms could be signs of other problems, such as heart disease, gastric bleeding, or esophageal bleeding. MAKE SURE YOU:   Understand these instructions.  Will watch your condition.  Will get help right away if you are not doing well or get worse. Document Released: 09/15/2005 Document Revised: 02/28/2012 Document Reviewed:  06/25/2011 [facility] Patient Information 2015 [place], [place]. This information is not intended to replace advice given to you by your health care provider. Make sure you discuss any questions you have with your health care provider.  Colon Polyps Polyps are lumps of extra tissue growing inside the body. Polyps can grow in the large intestine (colon). Most colon polyps are noncancerous (benign). However, some colon polyps can become cancerous over time. Polyps that are larger than a pea may be harmful. To be safe, caregivers remove and test all polyps. CAUSES  Polyps form when mutations in the genes cause your cells to grow and divide even though no more tissue is needed. RISK FACTORS There are a number of risk factors that can increase your chances of getting colon polyps. They include:  Being older than 50 years.  Family history of colon polyps or colon cancer.  Long-term colon diseases, such as colitis or Crohn disease.  Being overweight.  Smoking.  Being inactive.  Drinking too much alcohol. SYMPTOMS  Most small polyps do not cause symptoms. If symptoms are present, they may include:  Blood in the stool. The stool may look dark red or black.  Constipation or diarrhea that lasts longer than 1 week. DIAGNOSIS People often do not know they have polyps until their caregiver finds them during a regular checkup. Your caregiver can use 4 tests to check for polyps:  Digital rectal exam. The caregiver wears gloves and feels inside the rectum. This test would find polyps only in the rectum.  Barium enema. The caregiver puts a liquid called barium into your rectum before taking X-rays of your colon. Barium makes your colon look white. Polyps are dark, so they are easy to see in the X-ray pictures.  Sigmoidoscopy. A thin, flexible tube (sigmoidoscope) is placed into your rectum. The sigmoidoscope has a light and tiny camera in it. The caregiver uses the sigmoidoscope to look at the last  third of your colon.  Colonoscopy. This test is like sigmoidoscopy, but the caregiver looks at the entire colon. This is the most common method for finding and removing polyps. TREATMENT  Any polyps will be removed during a sigmoidoscopy or colonoscopy. The polyps are then tested for cancer. PREVENTION  To help lower your risk of getting more colon polyps:  Eat plenty of fruits and vegetables. Avoid eating fatty foods.  Do not smoke.  Avoid drinking alcohol.  Exercise every day.  Lose weight if recommended by your caregiver.  Eat plenty of calcium and folate. Foods that are rich in calcium include milk, cheese, and broccoli. Foods that are rich in folate include chickpeas, kidney beans, and spinach. HOME CARE INSTRUCTIONS Keep all follow-up appointments as directed by your caregiver. You may need periodic exams to check for polyps. SEEK MEDICAL CARE IF: You notice bleeding during a bowel movement.  Document Released: 09/01/2004 Document Revised: 02/28/2012 Document Reviewed: 02/15/2012 [facility] Patient Information 2015 [place], [place]. This information is not intended to replace advice given to you by your health care provider. Make sure you discuss any questions you have with your health care provider.   Diverticulosis Diverticulosis is the condition that develops when small pouches (diverticula) form in the wall of your colon. Your colon, or large intestine, is where water is absorbed and stool is formed. The pouches form when the inside layer of your colon pushes through weak spots in the outer layers of your colon. CAUSES  No one knows exactly what causes diverticulosis. RISK FACTORS  Being older than 43. Your risk for this condition increases with age. Diverticulosis is rare in people younger than 40 years. By age 41, almost everyone has it.  Eating a low-fiber diet.  Being frequently constipated.  Being overweight.  Not getting enough exercise.  Smoking.  Taking  over-the-counter pain medicines, like aspirin and ibuprofen. SYMPTOMS  Most people with diverticulosis do not have symptoms. DIAGNOSIS  Because diverticulosis often has no symptoms, health care providers often discover the condition during an exam for other colon problems. In many cases, a health care provider will diagnose diverticulosis while using a flexible scope to examine the colon (colonoscopy). TREATMENT  If you have never developed an infection related to diverticulosis, you may not need treatment. If you have had an infection before, treatment may include:  Eating more fruits, vegetables, and grains.  Taking a fiber supplement.  Taking a live bacteria supplement (probiotic).  Taking medicine to relax your colon. HOME CARE INSTRUCTIONS   Drink at least 6-8 glasses of water each day to prevent constipation.  Try not to strain when you have a bowel movement.  Keep all follow-up appointments. If you have had an infection before:  Increase the fiber in your diet as directed by your health care provider or dietitian.  Take a dietary fiber supplement if your health care provider approves.  Only take medicines as directed by your health care provider. SEEK MEDICAL CARE IF:   You have abdominal pain.  You have bloating.  You have cramps.  You have not gone to the bathroom in 3 days. SEEK IMMEDIATE MEDICAL CARE IF:   Your pain gets worse.  Yourbloating becomes very bad.  You have a fever or chills, and your symptoms suddenly get worse.  You begin vomiting.  You have bowel movements that are bloody or black. MAKE SURE YOU:  Understand these instructions.  Will watch your condition.  Will get help right away if you are not doing well or get worse. Document Released: 09/02/2004 Document Revised: 12/11/2013 Document Reviewed: 10/31/2013 [facility] Patient Information 2015 [place], [place]. This information is not intended to replace advice given to you by your  health care provider. Make sure you discuss any questions you have with your health care provider.

## 2015-02-27 NOTE — H&P (View-Only) (Signed)
Primary Care Physician:  Sallee Lange, MD  Primary Gastroenterologist:  Garfield Cornea, MD   Chief Complaint  Patient presents with  . OTHER    Schedule TCS and EGD    HPI:  Tara Dickson is a 54 y.o. female here to schedule colonoscopy plus or minus EGD to further evaluate anemia, Hemoccult-positive stool. Last seen January 2016. History of normocytic anemia, previously was slightly low iron saturation, low normal serum iron, low TIBC. Labs were repeated last month in her hemoglobin still low at 11.3 although her serum iron was improved. Her iron saturations remained low normal, ferritin normal. Cannot exclude a trend towards iron deficiency anemia plus anemia of chronic disease. Her last colonoscopy was in 2011, essentially unremarkable.  BM about 3 per week at times, but sometimes daily. Tries to control with diet. No laxatives. No melena, brbpr. No abdominal pain. No heartburn on PPI. If misses PPI, then bad heartburn. No dysphagia. No vomiting. Lost 20 pounds last spring but fairly stable since then. States she has no appetite.   Significant other has Hepatitis C. Last checked >5 years ago.  Current Outpatient Prescriptions  Medication Sig Dispense Refill  . aspirin 81 MG tablet Take 81 mg by mouth daily.    . carvedilol (COREG) 3.125 MG tablet TAKE (1) TABLET TWICE DAILY. 60 tablet 6  . diazepam (VALIUM) 5 MG tablet Take 5 mg by mouth 2 (two) times daily as needed for anxiety. Taking only one tablet daily in the AM    . ergocalciferol (VITAMIN D2) 50000 UNITS capsule Take 50,000 Units by mouth once a week.    . Evening Primrose topical oil Apply topically as needed for dry skin.    Marland Kitchen levETIRAcetam (KEPPRA) 500 MG tablet TAKE (1) TABLET TWICE DAILY. 60 tablet 3  . lisinopril (PRINIVIL,ZESTRIL) 10 MG tablet Take 1 tablet (10 mg total) by mouth daily. (Patient taking differently: Take 5 mg by mouth daily. ) 30 tablet 6  . omeprazole (PRILOSEC) 20 MG capsule TAKE 1 CAPSULE BY MOUTH  ONCE A DAY. 30 capsule 3  . pravastatin (PRAVACHOL) 40 MG tablet TAKE (1) TABLET BY MOUTH ONCE DAILY. 30 tablet 5  . sertraline (ZOLOFT) 100 MG tablet Take 100 mg by mouth daily. Takes 2 100 mg tablets in the Am     No current facility-administered medications for this visit.    Allergies as of 02/14/2015  . (No Known Allergies)    Past Medical History  Diagnosis Date  . Hypertension   . Anxiety   . Depression   . Bipolar 1 disorder   . Seizures 09/2006  . Osteoarthritis 2007    left knee  . Melanoma 12/2008    Right leg  . Impaired fasting glucose   . Cardiomyopathy, nonischemic 2014    Cardiomyopathy nonischemic possible Takasubo syndrome    Past Surgical History  Procedure Laterality Date  . Abdominal hysterectomy  2006  . Colonoscopy  08/2010    Dr. Gala Romney: Single hemorrhoidal tag, otherwise normal.  . Left and right heart catheterization with coronary angiogram N/A 10/18/2013    Procedure: LEFT AND RIGHT HEART CATHETERIZATION WITH CORONARY ANGIOGRAM;  Surgeon: Sinclair Grooms, MD;  Location: [facility] CATH LAB;  Service: Cardiovascular;  Laterality: N/A;  . Melanoma surgery    . Esophagogastroduodenoscopy  2005?    erosive reflux esophagitis per medical history  . Colonoscopy  2005?    C.diff per medical reports    Family History  Problem Relation Age of Onset  .  Hypertension Mother   . Hypertension Father   . Colon cancer Neg Hx     History   Social History  . Marital Status: Single    Spouse Name: N/A  . Number of Children: N/A  . Years of Education: N/A   Occupational History  . Not on file.   Social History Main Topics  . Smoking status: Former Smoker    Types: Cigarettes    Start date: 08/28/1973    Quit date: 10/18/1997  . Smokeless tobacco: Not on file     Comment: Quit smoking x 15 years  . Alcohol Use: No  . Drug Use: No  . Sexual Activity: Yes    Birth Control/ Protection: None   Other Topics Concern  . Not on file   Social History  Narrative      ROS:  General: +for anorexia, weight loss. No fever, chills, fatigue, weakness. Eyes: Negative for vision changes.  ENT: Negative for hoarseness, difficulty swallowing , nasal congestion. CV: Negative for chest pain, angina, palpitations, dyspnea on exertion, peripheral edema.  Respiratory: Negative for dyspnea at rest, dyspnea on exertion, cough, sputum, wheezing.  GI: See history of present illness. GU:  Negative for dysuria, hematuria, urinary incontinence, urinary frequency, nocturnal urination.  MS: +chronic for joint pain. no low back pain.  Derm: Negative for rash or itching.  Neuro: Negative for weakness, abnormal sensation, seizure, frequent headaches, memory loss, confusion.  Psych: Negative for anxiety, depression, suicidal ideation, hallucinations.  Endo: Negative for unusual weight change.  Heme: Negative for bruising or bleeding. Allergy: Negative for rash or hives.    Physical Examination:  BP 143/80 mmHg  Pulse 48  Temp(Src) 97 F (36.1 C) (Oral)  Ht 5\' 8"  (1.727 m)  Wt 185 lb 6.4 oz (84.097 kg)  BMI 28.20 kg/m2   General: Well-nourished, well-developed in no acute distress.  Head: Normocephalic, atraumatic.   Eyes: Conjunctiva pink, no icterus. Mouth: Oropharyngeal mucosa moist and pink , no lesions erythema or exudate. Neck: Supple without thyromegaly, masses, or lymphadenopathy.  Lungs: Clear to auscultation bilaterally.  Heart: Regular rate and rhythm, no murmurs rubs or gallops.  Abdomen: Bowel sounds are normal, nontender, nondistended, no hepatosplenomegaly or masses, no abdominal bruits or    hernia , no rebound or guarding.   Rectal: not performed Extremities: No lower extremity edema. No clubbing or deformities.  Neuro: Alert and oriented x 4 , grossly normal neurologically.  Skin: Warm and dry, no rash or jaundice.   Psych: Alert and cooperative, normal mood and affect.  Labs: Lab Results  Component Value Date   WBC 6.3  12/30/2014   HGB 11.3* 12/30/2014   HCT 34.6* 12/30/2014   MCV 90.6 12/30/2014   PLT 174 12/30/2014   Lab Results  Component Value Date   CREATININE 0.77 09/18/2014   BUN 6 09/18/2014   NA 144 09/18/2014   K 3.8 09/18/2014   CL 109 09/18/2014   CO2 30 09/18/2014   Lab Results  Component Value Date   ALT 31 09/18/2014   AST 31 09/18/2014   ALKPHOS 85 09/18/2014   BILITOT 0.4 09/18/2014   Lab Results  Component Value Date   IRON 62 12/30/2014   TIBC 299 12/30/2014   FERRITIN 73 12/30/2014     Imaging Studies: No results found.

## 2015-02-27 NOTE — Anesthesia Postprocedure Evaluation (Signed)
  Anesthesia Post-op Note  Patient: [name]  Procedure(s) Performed: Procedure(s): COLONOSCOPY WITH PROPOFOL At cecum at 1144; total withdrawal time=88minutes (N/A) ESOPHAGOGASTRODUODENOSCOPY (EGD) WITH PROPOFOL (N/A) POLYPECTOMY  Patient Location: PACU  Anesthesia Type:MAC  Level of Consciousness: awake, alert  and oriented  Airway and Oxygen Therapy: Patient Spontanous Breathing  Post-op Pain: none  Post-op Assessment: Post-op Vital signs reviewed, Patient's Cardiovascular Status Stable, Respiratory Function Stable, Patent Airway and No signs of Nausea or vomiting  Post-op Vital Signs: Reviewed and stable  Last Vitals:  Filed Vitals:   02/27/15 1100  BP: 129/87  Pulse:   Temp:   Resp: 17    Complications: No apparent anesthesia complications

## 2015-02-28 ENCOUNTER — Encounter: Payer: Self-pay | Admitting: Internal Medicine

## 2015-02-28 ENCOUNTER — Encounter (HOSPITAL_COMMUNITY): Payer: Self-pay | Admitting: Internal Medicine

## 2015-04-23 IMAGING — CR DG CHEST 1V PORT
1 series · 1 of 1 positions shown · non-contrast
Comparison: 10/17/2013

CLINICAL DATA: CHF

EXAM:
PORTABLE CHEST - 1 VIEW

[AP]
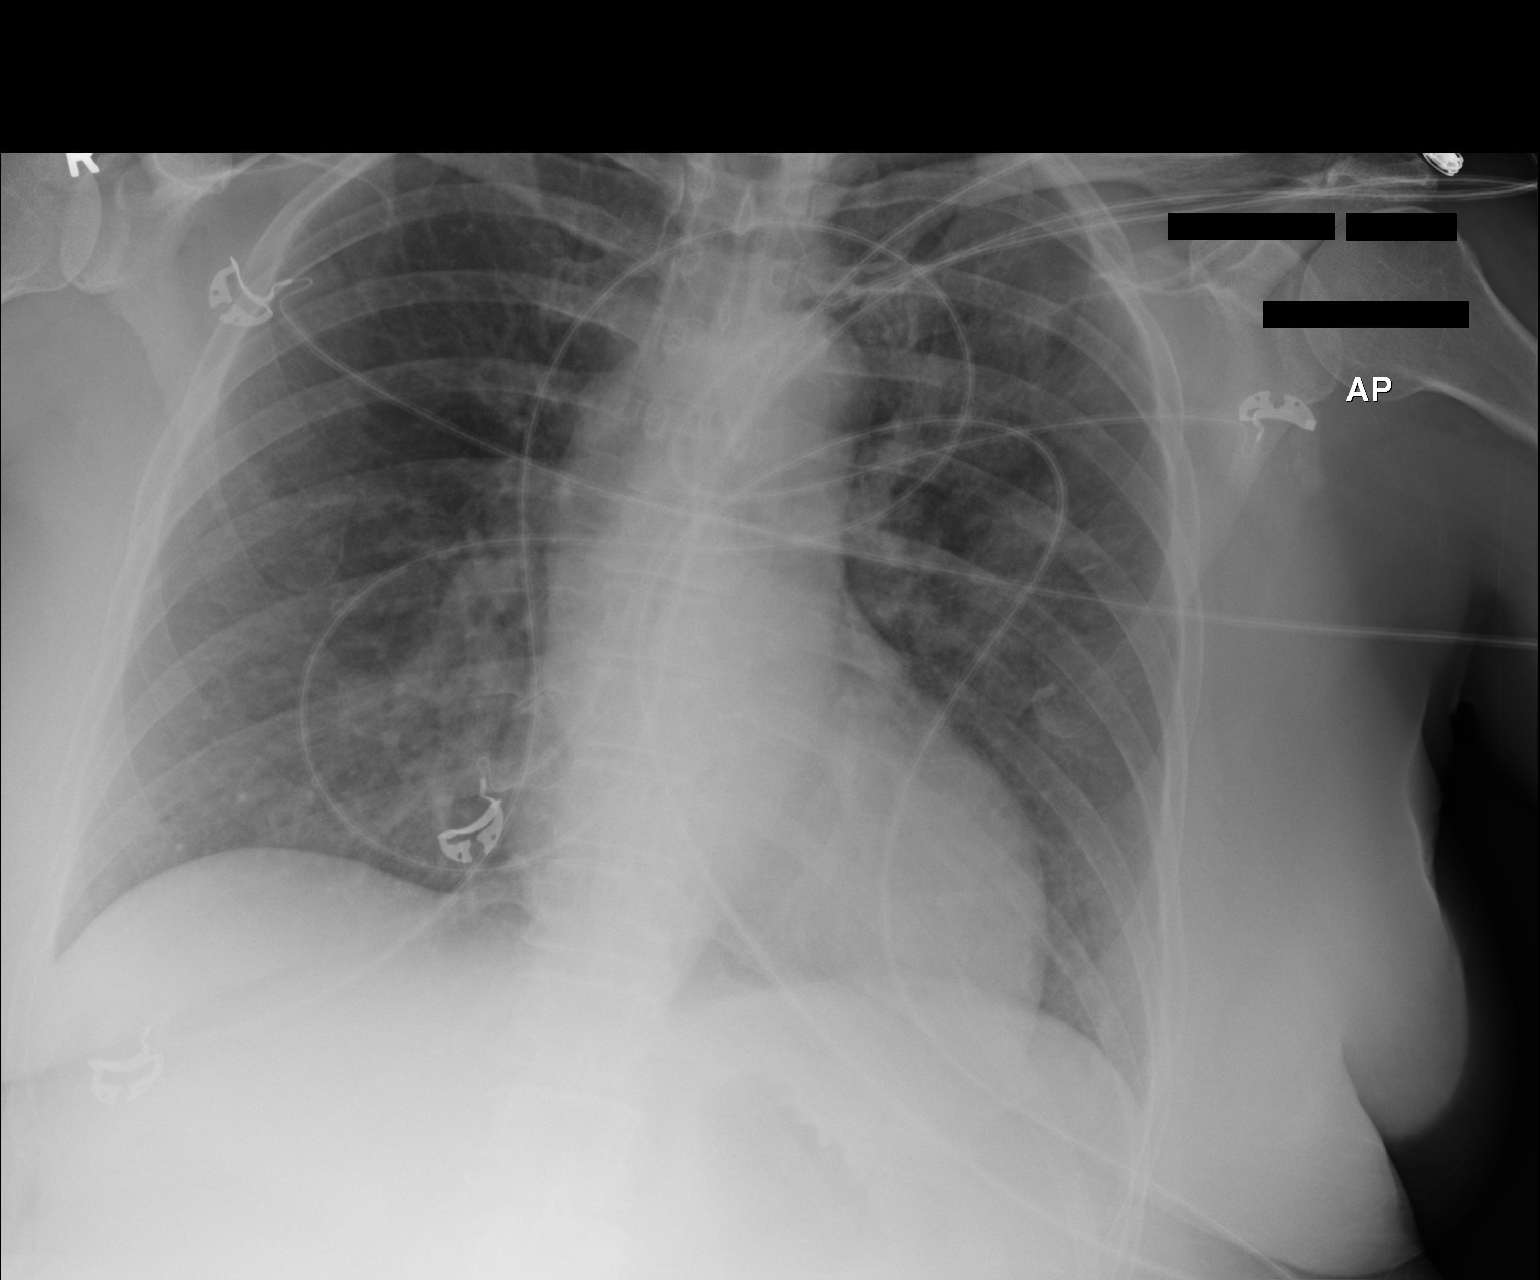

[1 of 1 positions shown; findings below may reference images not displayed]

FINDINGS: Very mild patchy opacity in the left perihilar region, possibly
reflecting pneumonia. No pleural effusion or pneumothorax.

The heart is normal in size.
IMPRESSION: Very mild patchy opacity in the left perihilar region, possibly
reflecting pneumonia.

## 2015-05-12 DIAGNOSIS — R7309 Other abnormal glucose: Secondary | ICD-10-CM | POA: Diagnosis not present

## 2015-05-12 DIAGNOSIS — Z20828 Contact with and (suspected) exposure to other viral communicable diseases: Secondary | ICD-10-CM | POA: Diagnosis not present

## 2015-05-12 DIAGNOSIS — Z205 Contact with and (suspected) exposure to viral hepatitis: Secondary | ICD-10-CM | POA: Diagnosis not present

## 2015-05-12 DIAGNOSIS — E785 Hyperlipidemia, unspecified: Secondary | ICD-10-CM | POA: Diagnosis not present

## 2015-05-13 LAB — LIPID PANEL
CHOL/HDL RATIO: 4.3 ratio
CHOLESTEROL: 189 mg/dL (ref 0–200)
HDL: 44 mg/dL — ABNORMAL LOW (ref >=46)
LDL CALC: 114 mg/dL — AB (ref 0–99)
TRIGLYCERIDES: 154 mg/dL — AB (ref <150)
VLDL: 31 mg/dL (ref 0–40)

## 2015-05-13 LAB — HEMOGLOBIN A1C
HEMOGLOBIN A1C: 5.8 % — AB (ref <5.7)
MEAN PLASMA GLUCOSE: 120 mg/dL — AB (ref <117)

## 2015-05-13 LAB — HEPATITIS C ANTIBODY: HCV Ab: NEGATIVE

## 2015-05-14 ENCOUNTER — Ambulatory Visit (INDEPENDENT_AMBULATORY_CARE_PROVIDER_SITE_OTHER): Payer: Medicare Other | Admitting: Family Medicine

## 2015-05-14 ENCOUNTER — Encounter: Payer: Self-pay | Admitting: Family Medicine

## 2015-05-14 VITALS — BP 138/72 | Ht 68.5 in | Wt 186.8 lb

## 2015-05-14 DIAGNOSIS — I10 Essential (primary) hypertension: Secondary | ICD-10-CM | POA: Diagnosis not present

## 2015-05-14 DIAGNOSIS — R7309 Other abnormal glucose: Secondary | ICD-10-CM | POA: Diagnosis not present

## 2015-05-14 DIAGNOSIS — D649 Anemia, unspecified: Secondary | ICD-10-CM

## 2015-05-14 DIAGNOSIS — R7303 Prediabetes: Secondary | ICD-10-CM

## 2015-05-14 DIAGNOSIS — M545 Low back pain: Secondary | ICD-10-CM

## 2015-05-14 DIAGNOSIS — G8929 Other chronic pain: Secondary | ICD-10-CM | POA: Diagnosis not present

## 2015-05-14 DIAGNOSIS — E785 Hyperlipidemia, unspecified: Secondary | ICD-10-CM

## 2015-05-14 MED ORDER — HYDROCODONE-ACETAMINOPHEN 5-325 MG PO TABS: 1.0000 | ORAL_TABLET | Freq: Two times a day (BID) | ORAL | Status: DC | PRN

## 2015-05-14 MED ORDER — CLORAZEPATE DIPOTASSIUM 3.75 MG PO TABS: 3.7500 mg | ORAL_TABLET | Freq: Two times a day (BID) | ORAL | Status: DC | PRN

## 2015-05-14 NOTE — Progress Notes (Signed)
   Subjective:    Patient ID: Tara Dickson, female    DOB: [birth year], 54 y.o.   MRN: [id number]  Hypertension This is a chronic problem. The current episode started more than 1 year ago. Risk factors for coronary artery disease include dyslipidemia, obesity and post-menopausal state. Treatments tried: lisinopril, coreg. There are no compliance problems.    Needs refill Hydrocodone   Review of Systems Denies chest tightness pressure pain shortness breath relates nervousness denies severe depression states she feels as doing well she denies hearing voices. Denies drug abuse. Does state she occasionally uses marijuana. She is been counseled that this is not healthy    Objective:   Physical Exam  Lungs clear hearts regular low back subjective discomfort negative straight leg raise extremities no edema skin warm dry there are no obvious skin lesions on her arms or legs. Neck no masses throat normal.      Assessment & Plan:  Chronic low back pain she was given 3 prescriptions for pain medicine she was also given the pain contract she was talked to at length about the safety of taking narcotics and the dangers of taking narcotics. In addition to this questions were answered. She will do a urine drug screen on next visit  Hyperlipidemia slightly worse watch diet stay physically active recheck this in the fall  Prediabetes minimize starches no new medicines recheck this again approximate 6 months  Chronic anxiety and depression stable on medications currently. Patient does not want to go to mental health currently she denies any serious trouble  Greater than 25 minutes spent with this patient in discussion of all these issues  Normocytic anemia not severe she states she's had this all of her life the B12 ferritin normal older endoscopy earlier this year good I did offer her a visit with hematology she defers.

## 2015-05-23 ENCOUNTER — Other Ambulatory Visit: Payer: Self-pay | Admitting: Family Medicine

## 2015-08-06 ENCOUNTER — Telehealth: Payer: Self-pay | Admitting: Family Medicine

## 2015-08-06 NOTE — Telephone Encounter (Signed)
So noted 

## 2015-08-06 NOTE — Telephone Encounter (Signed)
Patient had to take a half of a xanax because her dad is in the hospital.  She just wanted to let Dr. Nicki Reaper know before she has to take a drug test on Monday and it shows up.

## 2015-08-11 ENCOUNTER — Encounter: Payer: Self-pay | Admitting: Family Medicine

## 2015-08-11 ENCOUNTER — Ambulatory Visit (INDEPENDENT_AMBULATORY_CARE_PROVIDER_SITE_OTHER): Payer: Medicare Other | Admitting: Family Medicine

## 2015-08-11 VITALS — BP 130/80 | Ht 68.5 in | Wt 185.5 lb

## 2015-08-11 DIAGNOSIS — R7309 Other abnormal glucose: Secondary | ICD-10-CM

## 2015-08-11 DIAGNOSIS — M545 Low back pain: Secondary | ICD-10-CM

## 2015-08-11 DIAGNOSIS — Z79899 Other long term (current) drug therapy: Secondary | ICD-10-CM | POA: Diagnosis not present

## 2015-08-11 DIAGNOSIS — G8929 Other chronic pain: Secondary | ICD-10-CM

## 2015-08-11 DIAGNOSIS — I10 Essential (primary) hypertension: Secondary | ICD-10-CM | POA: Diagnosis not present

## 2015-08-11 DIAGNOSIS — R079 Chest pain, unspecified: Secondary | ICD-10-CM | POA: Diagnosis not present

## 2015-08-11 DIAGNOSIS — R7303 Prediabetes: Secondary | ICD-10-CM

## 2015-08-11 DIAGNOSIS — E785 Hyperlipidemia, unspecified: Secondary | ICD-10-CM

## 2015-08-11 MED ORDER — PRAVASTATIN SODIUM 40 MG PO TABS: ORAL_TABLET | ORAL | Status: DC

## 2015-08-11 MED ORDER — HYDROCODONE-ACETAMINOPHEN 5-325 MG PO TABS: 1.0000 | ORAL_TABLET | Freq: Two times a day (BID) | ORAL | Status: DC | PRN

## 2015-08-11 MED ORDER — LEVETIRACETAM 500 MG PO TABS: ORAL_TABLET | ORAL | Status: DC

## 2015-08-11 NOTE — Progress Notes (Signed)
Subjective:    Patient ID: Tara Dickson, female    DOB: Dec 22, 1960, 54 y.o.   MRN: [id number]  Chest Pain  This is a new problem. The current episode started 1 to 4 weeks ago. The onset quality is gradual. The problem occurs intermittently. The problem has been unchanged. The pain is present in the lateral region. The pain is moderate. Radiates to: no radiation last all day when it occurs. Pertinent negatives include no abdominal pain, cough or weakness. Associated with: stress and agravating people. She has tried rest for the symptoms. The treatment provided no relief.   This patient was seen today for chronic pain  The medication list was reviewed and updated.   -Compliance with pain medication: yes  The patient was advised the importance of maintaining medication and not using illegal substances with these.  Refills needed: yes  The patient was educated that we can provide 3 monthly scripts for their medication, it is their responsibility to follow the instructions.  Side effects or complications from medications: none  Patient is aware that pain medications are meant to minimize the severity of the pain to allow their pain levels to improve to allow for better function. They are aware of that pain medications cannot totally remove their pain.  Due for UDT ( at least once per year) : N/A  Patient has lumbar pain.  Patient states that she has bilateral ear pain. This has been present for 1 day now.  Patient states that she has been having chest pain for over 1 week now.     patient has hyperlipidemia she tries watch her diet she takes her medicine She has a history of cardiomyopathy her she denies shortness of breath she relates she takes her medicine on a regular basis  anxiety mental health overall doing well with medicines.  Review of Systems  Constitutional: Negative for activity change, appetite change and fatigue.  HENT: Negative for congestion.   Respiratory:  Negative for cough.   Cardiovascular: Positive for chest pain.  Gastrointestinal: Negative for abdominal pain.  Endocrine: Negative for polydipsia and polyphagia.  Neurological: Negative for weakness.  Psychiatric/Behavioral: Negative for confusion.       Objective:   Physical Exam  Constitutional: She appears well-nourished. No distress.  Cardiovascular: Normal rate, regular rhythm and normal heart sounds.   No murmur heard. Pulmonary/Chest: Effort normal and breath sounds normal. No respiratory distress.  Musculoskeletal: She exhibits no edema.  Lymphadenopathy:    She has no cervical adenopathy.  Neurological: She is alert. She exhibits normal muscle tone.  Psychiatric: Her behavior is normal.  Vitals reviewed.         Assessment & Plan:  1. Chest pain, unspecified chest pain type  this chest pain is atypical. I do not believe it is a sign of coronary artery disease. I encouraged patient to do a good job of watching diet. If the pains persist or worsen to follow-up. She is seen cardiology later in the fall. - PR ELECTROCARDIOGRAM, COMPLETE  2. HTN (hypertension), benign  blood pressure under good control.  patient on heart medication tolerating this well 3. Prediabetes  A1c was ordered. Low starch diet. Regular exercise recommended. - Hemoglobin A1c  4. Hyperlipidemia  continue cholesterol medicine check lipid liver profile await the results - Lipid panel  5. Chronic lumbar pain  chronic lumbar pain she states she takes anywhere between 1 and 3 tablets her day I allowed her to go ahead with 60 tablets per  month this will average 2 per day. Pain contract read to the patient discussed as well. Urine drug screen next visit.  6. High risk medication use  due to medications check liver and kidney functions - Hepatic function panel - Basic metabolic panel  patient follow-up 3 months

## 2015-08-11 NOTE — Patient Instructions (Addendum)
May try Melatonin 5 mg near bedtime to sleep

## 2015-08-14 ENCOUNTER — Telehealth: Payer: Self-pay | Admitting: Family Medicine

## 2015-08-14 MED ORDER — LISINOPRIL 5 MG PO TABS: 5.0000 mg | ORAL_TABLET | Freq: Every day | ORAL | Status: DC

## 2015-08-14 NOTE — Telephone Encounter (Signed)
Spoke with patient and informed her per Dr.Scott that lisinopril was changed to 5mg  and that refill was sent in. Also told patient to keep regular follow up visits. Patient verbalized understanding.

## 2015-08-14 NOTE — Telephone Encounter (Signed)
Change to 5 mg lisinopril, pt to keep regular follow up , change med in epiuc please

## 2015-08-14 NOTE — Telephone Encounter (Signed)
Pt is confused as to what she is supposed to be taking for her  BP pill, lisinopril (PRINIVIL,ZESTRIL) 10 MG tablet   She thought that at some point in a past OV (6-8 mos ago)  That she was supposed to only be taking 5 mg daily but her script Keeps coming in a 10 mg form. Do you wish for her to still take  The 5 mg? Please advise an call the pt with your dosage desire.

## 2015-08-19 ENCOUNTER — Encounter: Payer: Self-pay | Admitting: Family Medicine

## 2015-08-27 ENCOUNTER — Other Ambulatory Visit: Payer: Self-pay | Admitting: Cardiovascular Disease

## 2015-08-27 ENCOUNTER — Other Ambulatory Visit: Payer: Self-pay | Admitting: *Deleted

## 2015-08-27 ENCOUNTER — Telehealth: Payer: Self-pay | Admitting: Family Medicine

## 2015-08-27 MED ORDER — SULFAMETHOXAZOLE-TRIMETHOPRIM 800-160 MG PO TABS: 1.0000 | ORAL_TABLET | Freq: Two times a day (BID) | ORAL | Status: DC

## 2015-08-27 NOTE — Telephone Encounter (Signed)
Discussed with pt. Med sent to pharm.  

## 2015-08-27 NOTE — Telephone Encounter (Signed)
Patient was seen on 08/11/2015 by Dr. Nicki Reaper for her regular check up.  She said she had mentioned her ears bothering her, but Dr. Nicki Reaper wanted to wait and see what they were going to do.  She said she is still having the ear pain, cough, a little congestion.  Please advise.   [place]

## 2015-08-27 NOTE — Telephone Encounter (Signed)
Bactrim DS one bid 10 days, if ongoing or worse trhen needs follow up

## 2015-08-27 NOTE — Telephone Encounter (Signed)
Pt feels warm but has not checked her temp, having yellow drainage, cough, and ear pain, no wheezing or trouble breathing. Mentioned at last visit. Symptoms getting worse.

## 2015-09-02 ENCOUNTER — Other Ambulatory Visit: Payer: Self-pay | Admitting: Family Medicine

## 2015-10-03 ENCOUNTER — Other Ambulatory Visit: Payer: Self-pay | Admitting: Internal Medicine

## 2015-10-14 ENCOUNTER — Other Ambulatory Visit: Payer: Self-pay | Admitting: Family Medicine

## 2015-10-14 NOTE — Telephone Encounter (Signed)
May refill this +3 additional refills 

## 2015-10-29 DIAGNOSIS — E785 Hyperlipidemia, unspecified: Secondary | ICD-10-CM | POA: Diagnosis not present

## 2015-10-29 DIAGNOSIS — R7309 Other abnormal glucose: Secondary | ICD-10-CM | POA: Diagnosis not present

## 2015-10-29 DIAGNOSIS — Z79899 Other long term (current) drug therapy: Secondary | ICD-10-CM | POA: Diagnosis not present

## 2015-10-30 LAB — LIPID PANEL
CHOL/HDL RATIO: 4.7 ratio — AB (ref 0.0–4.4)
Cholesterol, Total: 194 mg/dL (ref 100–199)
HDL: 41 mg/dL (ref >39)
LDL Calculated: 113 mg/dL — ABNORMAL HIGH (ref 0–99)
TRIGLYCERIDES: 200 mg/dL — AB (ref 0–149)
VLDL Cholesterol Cal: 40 mg/dL (ref 5–40)

## 2015-10-30 LAB — BASIC METABOLIC PANEL
BUN / CREAT RATIO: 8 — AB (ref 9–23)
BUN: 7 mg/dL (ref 6–24)
CO2: 26 mmol/L (ref 18–29)
CREATININE: 0.93 mg/dL (ref 0.57–1.00)
Calcium: 9.4 mg/dL (ref 8.7–10.2)
Chloride: 103 mmol/L (ref 97–106)
GFR calc non Af Amer: 69 mL/min/{1.73_m2} (ref >59)
GFR, EST AFRICAN AMERICAN: 80 mL/min/{1.73_m2} (ref >59)
GLUCOSE: 105 mg/dL — AB (ref 65–99)
Potassium: 4 mmol/L (ref 3.5–5.2)
SODIUM: 145 mmol/L — AB (ref 136–144)

## 2015-10-30 LAB — HEPATIC FUNCTION PANEL
ALK PHOS: 75 IU/L (ref 39–117)
ALT: 16 IU/L (ref 0–32)
AST: 18 IU/L (ref 0–40)
Albumin: 4.2 g/dL (ref 3.5–5.5)
BILIRUBIN TOTAL: 0.3 mg/dL (ref 0.0–1.2)
Bilirubin, Direct: 0.08 mg/dL (ref 0.00–0.40)
TOTAL PROTEIN: 6.8 g/dL (ref 6.0–8.5)

## 2015-10-30 LAB — HEMOGLOBIN A1C
Est. average glucose Bld gHb Est-mCnc: 120 mg/dL
HEMOGLOBIN A1C: 5.8 % — AB (ref 4.8–5.6)

## 2015-11-03 ENCOUNTER — Encounter: Payer: Self-pay | Admitting: Family Medicine

## 2015-11-03 ENCOUNTER — Ambulatory Visit (INDEPENDENT_AMBULATORY_CARE_PROVIDER_SITE_OTHER): Payer: Medicare Other | Admitting: Family Medicine

## 2015-11-03 VITALS — BP 100/70 | Ht 68.5 in | Wt 189.4 lb

## 2015-11-03 DIAGNOSIS — Z85820 Personal history of malignant melanoma of skin: Secondary | ICD-10-CM | POA: Diagnosis not present

## 2015-11-03 DIAGNOSIS — R7303 Prediabetes: Secondary | ICD-10-CM

## 2015-11-03 DIAGNOSIS — Z79891 Long term (current) use of opiate analgesic: Secondary | ICD-10-CM

## 2015-11-03 DIAGNOSIS — R0600 Dyspnea, unspecified: Secondary | ICD-10-CM | POA: Diagnosis not present

## 2015-11-03 DIAGNOSIS — E785 Hyperlipidemia, unspecified: Secondary | ICD-10-CM | POA: Diagnosis not present

## 2015-11-03 MED ORDER — PRAVASTATIN SODIUM 80 MG PO TABS: ORAL_TABLET | ORAL | Status: DC

## 2015-11-03 MED ORDER — HYDROCODONE-ACETAMINOPHEN 5-325 MG PO TABS: 1.0000 | ORAL_TABLET | Freq: Two times a day (BID) | ORAL | Status: DC | PRN

## 2015-11-03 MED ORDER — OMEPRAZOLE 40 MG PO CPDR: DELAYED_RELEASE_CAPSULE | ORAL | Status: DC

## 2015-11-03 MED ORDER — SERTRALINE HCL 100 MG PO TABS: 200.0000 mg | ORAL_TABLET | Freq: Every day | ORAL | Status: DC

## 2015-11-03 MED ORDER — LISINOPRIL 5 MG PO TABS: 5.0000 mg | ORAL_TABLET | Freq: Every day | ORAL | Status: DC

## 2015-11-03 NOTE — Progress Notes (Signed)
   Subjective:    Patient ID: Tara Dickson, female    DOB: 1961-05-22, 54 y.o.   MRN: [id number]  HPI This patient was seen today for chronic pain  The medication list was reviewed and updated.   -Compliance with medication: yes  - Number patient states they take daily: 2 daily   -when was the last dose patient took: yesterday   The patient was advised the importance of maintaining medication and not using illegal substances with these.  Refills needed: yes  The patient was educated that we can provide 3 monthly scripts for their medication, it is their responsibility to follow the instructions.  Side effects or complications from medications: none  Patient is aware that pain medications are meant to minimize the severity of the pain to allow their pain levels to improve to allow for better function. They are aware of that pain medications cannot totally remove their pain. Patient states that the pain medication helps. Does not totally take away the pain but makes it more tolerable. Due for UDT ( at least once per year) : yes   Patient states that she is experiencing shortness of breath. Onset 3 weeks ago.   Patient does not smoke cigarettes but does state she occasionally smokes marijuana. She denies operating a vehicle with smoking. Patient is aware that she should do her best to not smoke marijuana. Patient denies any abnormal skin lesions. Patient under stress because her mother is dying of metastatic melanoma Patient has a long history of smoking but does not smoke cigarettes currently.   Review of Systems Denies chest tightness pressure pain some shortness of breath when she pushes herself denies fever chills sweats. Denies weight loss.    Objective:   Physical Exam   Her lungs are clear there are no crackles heart regular no murmurs no gallops pulse normal blood pressure good extremities minimal edema     Assessment & Plan:  Patient relates occasional  dyspnea. Has remote history of smoking. We will do a chest x-ray. She is not had any swelling in her legs pains in her legs nothing to suggest DVT. She denies any chest tightness pressure pain nothing to suggest angina  If chest x-ray is normal them patient will need further evaluation with other potential testing to rule out other causes  Her lab work was reviewed prediabetes watch sugar content closely exercise try to keep weight down Hyperlipidemia reasonable levels but increase pravastatin from 40 mg 80 mg recheck lab work in the spring time Skin exam completed no signs of melanoma.  Urine drug screen collected today Pain medication contract reviewed. The importance of not taking pain medicine at bedtime plus also not combining it with other measures The patient was seen today as part of a comprehensive visit regarding pain control. Patient's compliance with the medication as well as discussion regarding effectiveness was completed. Prescriptions were written. Patient was advised to follow-up in 3 months. The patient was assessed for any signs of severe side effects. The patient was advised to take the medicine as directed and to report to [place] if any side effect issues. 25 minutes was spent with the patient. Greater than half the time was spent in discussion and answering questions and counseling regarding the issues that the patient came in for today.

## 2015-11-03 NOTE — Patient Instructions (Signed)

## 2015-11-05 ENCOUNTER — Ambulatory Visit (HOSPITAL_COMMUNITY)
Admission: RE | Admit: 2015-11-05 | Discharge: 2015-11-05 | Disposition: A | Discharge status: Home / Self Care | Payer: Medicare Other | Source: Ambulatory Visit | Attending: Family Medicine | Admitting: Family Medicine

## 2015-11-05 ENCOUNTER — Other Ambulatory Visit: Payer: Self-pay | Admitting: *Deleted

## 2015-11-05 DIAGNOSIS — I517 Cardiomegaly: Secondary | ICD-10-CM | POA: Insufficient documentation

## 2015-11-05 DIAGNOSIS — R0600 Dyspnea, unspecified: Secondary | ICD-10-CM | POA: Insufficient documentation

## 2015-11-05 MED ORDER — OMEPRAZOLE 40 MG PO CPDR: DELAYED_RELEASE_CAPSULE | ORAL | Status: DC

## 2015-11-05 MED ORDER — CARVEDILOL 3.125 MG PO TABS: ORAL_TABLET | ORAL | Status: DC

## 2015-11-05 MED ORDER — LISINOPRIL 5 MG PO TABS: 5.0000 mg | ORAL_TABLET | Freq: Every day | ORAL | Status: DC

## 2015-11-08 LAB — TOXASSURE SELECT 13 (MW), URINE: PDF: 0

## 2015-11-12 ENCOUNTER — Telehealth: Payer: Self-pay | Admitting: Family Medicine

## 2015-11-12 ENCOUNTER — Other Ambulatory Visit: Payer: Self-pay | Admitting: *Deleted

## 2015-11-12 MED ORDER — LISINOPRIL 5 MG PO TABS: 5.0000 mg | ORAL_TABLET | Freq: Every day | ORAL | Status: DC

## 2015-11-12 MED ORDER — PRAVASTATIN SODIUM 80 MG PO TABS: ORAL_TABLET | ORAL | Status: DC

## 2015-11-12 MED ORDER — CARVEDILOL 3.125 MG PO TABS: ORAL_TABLET | ORAL | Status: DC

## 2015-11-12 MED ORDER — SERTRALINE HCL 100 MG PO TABS
200.0000 mg | ORAL_TABLET | Freq: Every day | ORAL | Status: DC
Start: 2015-11-12 — End: 2016-01-31

## 2015-11-12 NOTE — Telephone Encounter (Signed)
Patient requesting Rx for zoloft, pravastatin, carvedilol, lisinopril, omeprazole,     [facility]

## 2015-11-12 NOTE — Telephone Encounter (Signed)
meds sent to pharm. Pt notified on vm.  

## 2015-11-19 ENCOUNTER — Ambulatory Visit (INDEPENDENT_AMBULATORY_CARE_PROVIDER_SITE_OTHER): Payer: Medicare Other | Admitting: Cardiovascular Disease

## 2015-11-19 ENCOUNTER — Other Ambulatory Visit: Payer: Self-pay | Admitting: *Deleted

## 2015-11-19 ENCOUNTER — Encounter: Payer: Self-pay | Admitting: *Deleted

## 2015-11-19 ENCOUNTER — Encounter: Payer: Self-pay | Admitting: Cardiovascular Disease

## 2015-11-19 ENCOUNTER — Telehealth: Payer: Self-pay | Admitting: Cardiovascular Disease

## 2015-11-19 ENCOUNTER — Ambulatory Visit (HOSPITAL_COMMUNITY)
Admission: RE | Admit: 2015-11-19 | Discharge: 2015-11-19 | Disposition: A | Discharge status: Home / Self Care | Payer: Medicare Other | Source: Ambulatory Visit | Attending: Family Medicine | Admitting: Family Medicine

## 2015-11-19 VITALS — BP 132/90 | HR 70 | Ht 68.0 in | Wt 189.0 lb

## 2015-11-19 DIAGNOSIS — R0602 Shortness of breath: Secondary | ICD-10-CM | POA: Diagnosis not present

## 2015-11-19 DIAGNOSIS — I10 Essential (primary) hypertension: Secondary | ICD-10-CM | POA: Diagnosis not present

## 2015-11-19 DIAGNOSIS — R0789 Other chest pain: Secondary | ICD-10-CM

## 2015-11-19 DIAGNOSIS — E785 Hyperlipidemia, unspecified: Secondary | ICD-10-CM | POA: Diagnosis not present

## 2015-11-19 DIAGNOSIS — R0600 Dyspnea, unspecified: Secondary | ICD-10-CM

## 2015-11-19 DIAGNOSIS — I5181 Takotsubo syndrome: Secondary | ICD-10-CM | POA: Diagnosis not present

## 2015-11-19 DIAGNOSIS — I517 Cardiomegaly: Secondary | ICD-10-CM

## 2015-11-19 NOTE — Progress Notes (Signed)
Patient ID: Tara Dickson, female   DOB: 1961-09-21, 54 y.o.   MRN: [id number]      SUBJECTIVE: The patient is here to followup for stress-induced cardiomyopathy and hypertension. Her left ventricular systolic function has since normalized.   Recent chest xray showed borderline cardiomegaly.  Echocardiogram performed on 11/19/15 demonstrated normal LV systolic function, EF 0000000, grade 1 diastolic dysfunction, and mild aortic and mitral regurgitation.  She has been having occasional exertional dyspnea but also complains of exertional chest pressure located retrosternally, described as "an elephant sitting on my chest". She initially attributed it to anxiety but no longer thinks this is the case.  ECG in 07/2015 was normal.   Review of Systems: As per "subjective", otherwise negative.  No Known Allergies  Current Outpatient Prescriptions  Medication Sig Dispense Refill  . aspirin 81 MG tablet Take 81 mg by mouth daily.    . carvedilol (COREG) 3.125 MG tablet TAKE (1) TABLET TWICE DAILY. 180 tablet 1  . clorazepate (TRANXENE) 3.75 MG tablet TAKE 1 TABLET BY MOUTH TWICE DAILY AS NEEDED FOR ANXIETY. 60 tablet 3  . Evening Primrose topical oil Apply topically as needed for dry skin.    Marland Kitchen HYDROcodone-acetaminophen (NORCO/VICODIN) 5-325 MG tablet Take 1 tablet by mouth 2 (two) times daily as needed for moderate pain. 60 tablet 0  . levETIRAcetam (KEPPRA) 500 MG tablet TAKE (1) TABLET TWICE DAILY. 60 tablet 3  . lisinopril (PRINIVIL,ZESTRIL) 5 MG tablet Take 1 tablet (5 mg total) by mouth daily. 90 tablet 1  . Melatonin-Pyridoxine (MELATIN PO) Take 5 mg by mouth as needed.    Marland Kitchen omeprazole (PRILOSEC) 40 MG capsule TAKE 1 CAPSULE BY MOUTH ONCE A DAY. 90 capsule 1  . OVER THE COUNTER MEDICATION Potassium 595 mg 2 times weekly    . pravastatin (PRAVACHOL) 80 MG tablet TAKE (1) TABLET BY MOUTH ONCE DAILY. 90 tablet 1  . sertraline (ZOLOFT) 100 MG tablet Take 2 tablets (200 mg total) by mouth  daily. 180 tablet 1  . VITAMIN D, CHOLECALCIFEROL, PO Take by mouth 2 (two) times a week.     No current facility-administered medications for this visit.    Past Medical History  Diagnosis Date  . Hypertension   . Anxiety   . Depression   . Bipolar 1 disorder ([place])   . Osteoarthritis 2007    left knee  . Melanoma ([place]) 12/2008    Right leg  . Impaired fasting glucose   . Cardiomyopathy, nonischemic ([place]) 2014    Cardiomyopathy nonischemic possible Takasubo syndrome  . Myocardial infarction ([place]) 09/2013  . Hypercholesteremia   . GERD (gastroesophageal reflux disease)   . Seizures ([place]) 09/2006    last seizure was 2 years ago, unknown etiology for seizures, possible taking her off "my xanax".  . Chronic back pain   . Fibromyalgia     Past Surgical History  Procedure Laterality Date  . Abdominal hysterectomy  2006  . Colonoscopy  08/2010    Dr. Gala Romney: Single hemorrhoidal tag, otherwise normal.  . Left and right heart catheterization with coronary angiogram N/A 10/18/2013    Procedure: LEFT AND RIGHT HEART CATHETERIZATION WITH CORONARY ANGIOGRAM;  Surgeon: Sinclair Grooms, MD;  Location: [facility] CATH LAB;  Service: Cardiovascular;  Laterality: N/A;  . Melanoma surgery    . Esophagogastroduodenoscopy  2005?    erosive reflux esophagitis per medical history  . Colonoscopy  2005?    C.diff per medical reports  . Colonoscopy with propofol N/A 02/27/2015  Procedure: COLONOSCOPY WITH PROPOFOL At cecum at 1144; total withdrawal time=78minutes;  Surgeon: Daneil Dolin, MD;  Location: [facility] ORS;  Service: Endoscopy;  Laterality: N/A;  . Esophagogastroduodenoscopy (egd) with propofol N/A 02/27/2015    Procedure: ESOPHAGOGASTRODUODENOSCOPY (EGD) WITH PROPOFOL;  Surgeon: Daneil Dolin, MD;  Location: [facility] ORS;  Service: Endoscopy;  Laterality: N/A;  . Polypectomy  02/27/2015    Procedure: POLYPECTOMY;  Surgeon: Daneil Dolin, MD;  Location: [facility] ORS;  Service: Endoscopy;;    Social History     Social History  . Marital Status: Single    Spouse Name: N/A  . Number of Children: N/A  . Years of Education: N/A   Occupational History  . Not on file.   Social History Main Topics  . Smoking status: Former Smoker -- 2.00 packs/day for 15 years    Types: Cigarettes    Start date: 08/28/1973    Quit date: 10/18/1997  . Smokeless tobacco: Never Used     Comment: Quit smoking x 15 years  . Alcohol Use: No  . Drug Use: No  . Sexual Activity: Yes    Birth Control/ Protection: None   Other Topics Concern  . Not on file   Social History Narrative     Filed Vitals:   11/19/15 1501  BP: 132/90  Pulse: 70  Height: 5\' 8"  (1.727 m)  Weight: 189 lb (85.73 kg)  SpO2: 97%    PHYSICAL EXAM General: NAD HEENT: Normal. Neck: No JVD, no thyromegaly. Lungs: Clear to auscultation bilaterally with normal respiratory effort. CV: Nondisplaced PMI.  Regular rate and rhythm, normal S1/S2, no S3/S4, no murmur. No pretibial or periankle edema.  No carotid bruit.    Abdomen: Soft, nontender, no hepatosplenomegaly, no distention.  Neurologic: Alert and oriented x 3.  Psych: Normal affect. Skin: Normal. Musculoskeletal: Normal range of motion, no gross deformities. Extremities: No clubbing or cyanosis.   ECG: Most recent ECG reviewed.      ASSESSMENT AND PLAN: 1. Stress-induced cardiomyopathy: LV systolic function is normal as noted above. No changes to therapy.  2. Essential hypertension: Borderline controlled on current therapy. No changes.  3. Exertional chest tightness: Will obtain an exercise treadmill stress test.  Dispo: f/u 1 year or sooner if there are abnormalities with stress testing.   Kate Sable, M.D., F.A.C.C.

## 2015-11-19 NOTE — Patient Instructions (Signed)
Your physician recommends that you continue on your current medications as directed. Please refer to the Current Medication list given to you today. Your physician has requested that you have an exercise tolerance test. For further information please visit [email or web address]. Please also follow instruction sheet, as given. Your physician recommends that you schedule a follow-up appointment in: 1 year. You will receive a reminder letter in the mail in about 10 months reminding you to call and schedule your appointment. If you don't receive this letter, please contact our office.

## 2015-11-19 NOTE — Telephone Encounter (Signed)
No precert required for GXT °

## 2015-11-19 NOTE — Telephone Encounter (Signed)
GXT holding pm and am doses of carvedilol dx: chest tightness Scheduled at [facility] Dec 6th arrive at 7:45

## 2015-11-21 ENCOUNTER — Other Ambulatory Visit: Payer: Self-pay | Admitting: *Deleted

## 2015-11-21 ENCOUNTER — Telehealth: Payer: Self-pay | Admitting: Family Medicine

## 2015-11-21 MED ORDER — CLONAZEPAM 1 MG PO TABS: ORAL_TABLET | ORAL | Status: DC

## 2015-11-21 NOTE — Telephone Encounter (Signed)
Patient called stating her anxiety medication tranxene 3.75 mg not strong enough. She lost her mother this morning and she states cant stop crying and wanting something stronger for her nerves. [place]

## 2015-11-21 NOTE — Telephone Encounter (Signed)
Discussed with pt. Med sent to pharm.  

## 2015-11-21 NOTE — Telephone Encounter (Signed)
Klonopin 1 mg, one half to one tablet 3 times a day as needed for anxiety do not take with pain medicine, #30, 1 refill stop Tranxene-follow-up if problems

## 2015-11-21 NOTE — Telephone Encounter (Signed)
Med faxed to pharm. [facility] with pt.

## 2015-11-25 ENCOUNTER — Encounter (HOSPITAL_COMMUNITY): Payer: Medicare Other

## 2015-12-03 ENCOUNTER — Other Ambulatory Visit: Payer: Self-pay | Admitting: Cardiovascular Disease

## 2015-12-03 ENCOUNTER — Other Ambulatory Visit: Payer: Self-pay | Admitting: Family Medicine

## 2015-12-26 ENCOUNTER — Other Ambulatory Visit: Payer: Self-pay | Admitting: *Deleted

## 2015-12-28 NOTE — Progress Notes (Signed)
May have this and 2 refills 

## 2015-12-29 MED ORDER — CLONAZEPAM 1 MG PO TABS: ORAL_TABLET | ORAL | Status: DC

## 2016-01-27 ENCOUNTER — Ambulatory Visit (HOSPITAL_COMMUNITY): Admission: RE | Admit: 2016-01-27 | Payer: Medicare Other | Source: Ambulatory Visit

## 2016-01-30 ENCOUNTER — Ambulatory Visit (HOSPITAL_COMMUNITY)
Admission: RE | Admit: 2016-01-30 | Discharge: 2016-01-30 | Disposition: A | Discharge status: Home / Self Care | Payer: Medicare Other | Source: Ambulatory Visit | Attending: Cardiovascular Disease | Admitting: Cardiovascular Disease

## 2016-01-30 DIAGNOSIS — R0789 Other chest pain: Secondary | ICD-10-CM | POA: Diagnosis not present

## 2016-01-30 LAB — EXERCISE TOLERANCE TEST
CHL CUP RESTING HR STRESS: 62 {beats}/min
CHL RATE OF PERCEIVED EXERTION: 11
CSEPED: 2 min
CSEPEDS: 16 s
Estimated workload: 4.6 METS
MPHR: 165 {beats}/min
Peak HR: 117 {beats}/min
Percent HR: 70 %

## 2016-01-31 ENCOUNTER — Other Ambulatory Visit: Payer: Self-pay | Admitting: Family Medicine

## 2016-02-02 ENCOUNTER — Telehealth: Payer: Self-pay | Admitting: *Deleted

## 2016-02-02 NOTE — Telephone Encounter (Signed)
Called patient with test results. No answer. Left message to call back.  

## 2016-02-02 NOTE — Telephone Encounter (Signed)
-----   Message from Herminio Commons, MD sent at 02/02/2016  1:01 PM EST ----- Unable to achieve adequate HR. If she is still having chest tightness, would recommend Lexiscan.

## 2016-02-03 ENCOUNTER — Ambulatory Visit: Payer: Medicare Other | Admitting: Family Medicine

## 2016-02-04 ENCOUNTER — Ambulatory Visit (INDEPENDENT_AMBULATORY_CARE_PROVIDER_SITE_OTHER): Payer: Medicare Other | Admitting: Family Medicine

## 2016-02-04 ENCOUNTER — Encounter: Payer: Self-pay | Admitting: Family Medicine

## 2016-02-04 VITALS — BP 128/90 | Ht 68.5 in | Wt 191.1 lb

## 2016-02-04 DIAGNOSIS — Z23 Encounter for immunization: Secondary | ICD-10-CM | POA: Diagnosis not present

## 2016-02-04 DIAGNOSIS — M545 Low back pain, unspecified: Secondary | ICD-10-CM

## 2016-02-04 DIAGNOSIS — E785 Hyperlipidemia, unspecified: Secondary | ICD-10-CM

## 2016-02-04 DIAGNOSIS — G8929 Other chronic pain: Secondary | ICD-10-CM

## 2016-02-04 DIAGNOSIS — F419 Anxiety disorder, unspecified: Secondary | ICD-10-CM

## 2016-02-04 DIAGNOSIS — R7303 Prediabetes: Secondary | ICD-10-CM | POA: Diagnosis not present

## 2016-02-04 MED ORDER — CLORAZEPATE DIPOTASSIUM 3.75 MG PO TABS: ORAL_TABLET | ORAL | Status: DC

## 2016-02-04 MED ORDER — HYDROCODONE-ACETAMINOPHEN 5-325 MG PO TABS: 1.0000 | ORAL_TABLET | Freq: Three times a day (TID) | ORAL | Status: DC | PRN

## 2016-02-04 MED ORDER — LISINOPRIL 10 MG PO TABS: 10.0000 mg | ORAL_TABLET | Freq: Every day | ORAL | Status: DC

## 2016-02-04 NOTE — Progress Notes (Signed)
   Subjective:    Patient ID: Tara Dickson, female    DOB: 05/08/1961, 55 y.o.   MRN: [id number]  HPI This patient was seen today for chronic pain  The medication list was reviewed and updated.   -Compliance with medication: yes  - Number patient states they take daily: 3 daily for the past week  -when was the last dose patient took: today   The patient was advised the importance of maintaining medication and not using illegal substances with these.  Refills needed: yes  The patient was educated that we can provide 3 monthly scripts for their medication, it is their responsibility to follow the instructions.  Side effects or complications from medications: none  Patient is aware that pain medications are meant to minimize the severity of the pain to allow their pain levels to improve to allow for better function. They are aware of that pain medications cannot totally remove their pain.  Due for UDT ( at least once per year) : utd  Patient states that she would like to talk about her anxiety medication.  Patient states she that she is at times mean and irritable. She also relates that she has a hard time controlling her anxiety. I informed her that I felt that she was seeking too much out of her medications we currently have her on I told her I thought she would benefit from seeing a psychiatrist as well as a counselor on a regular basis   Blood pressure subpar today we discussed bumping up the dose of the medicine as well as exercise watching diet    patient relates reflux under good control current medication.   patient takes her cholesterol medicine denies any problem but does need to recheck her lab work.     Review of Systems  denies chest tightness pressure pain shortness breath nausea vomiting diarrhea. Denies fever chills sweats relates chronic low back pain    Objective:   Physical Exam  lungs clear heart regular pulse normal extremities no edema skin warm dry  neurologic grossly normal   25 minutes was spent with the patient. Greater than half the time was spent in discussion and answering questions and counseling regarding the issues that the patient came in for today.     Assessment & Plan:   lumbar pain discomfort-prescription for pain medicines given. Patient does not abuse medication. Follow-up 3 months Hypertension good control continue current medications watch diet  Hyperlipidemia check lipid profile. Watch diet closely Prediabetes importance of minimizing starches in the diet regular physical activity discussed   significant anxiety referral to psychiatry patient also has history of bipolar  Follow-up 3 months

## 2016-02-04 NOTE — Patient Instructions (Signed)

## 2016-02-09 ENCOUNTER — Encounter: Payer: Self-pay | Admitting: Family Medicine

## 2016-02-11 ENCOUNTER — Telehealth (HOSPITAL_COMMUNITY): Payer: Self-pay | Admitting: *Deleted

## 2016-02-17 ENCOUNTER — Telehealth (HOSPITAL_COMMUNITY): Payer: Self-pay | Admitting: *Deleted

## 2016-02-17 NOTE — Telephone Encounter (Signed)
Office received ref for pt to be scheduled for new pt visit. Called pt and lmtcb number was provided.

## 2016-02-18 DIAGNOSIS — E785 Hyperlipidemia, unspecified: Secondary | ICD-10-CM | POA: Diagnosis not present

## 2016-02-19 LAB — LIPID PANEL
CHOL/HDL RATIO: 3.8 ratio (ref 0.0–4.4)
Cholesterol, Total: 197 mg/dL (ref 100–199)
HDL: 52 mg/dL (ref >39)
LDL CALC: 121 mg/dL — AB (ref 0–99)
TRIGLYCERIDES: 121 mg/dL (ref 0–149)
VLDL Cholesterol Cal: 24 mg/dL (ref 5–40)

## 2016-02-20 ENCOUNTER — Other Ambulatory Visit: Payer: Self-pay | Admitting: *Deleted

## 2016-02-20 DIAGNOSIS — E785 Hyperlipidemia, unspecified: Secondary | ICD-10-CM

## 2016-02-20 DIAGNOSIS — Z79899 Other long term (current) drug therapy: Secondary | ICD-10-CM

## 2016-02-20 MED ORDER — ATORVASTATIN CALCIUM 40 MG PO TABS: 40.0000 mg | ORAL_TABLET | Freq: Every day | ORAL | Status: DC

## 2016-03-18 ENCOUNTER — Telehealth (HOSPITAL_COMMUNITY): Payer: Self-pay | Admitting: *Deleted

## 2016-03-18 NOTE — Telephone Encounter (Signed)
Called pt to resch appt for April 10, lmtcb and number provided

## 2016-03-29 ENCOUNTER — Ambulatory Visit (HOSPITAL_COMMUNITY): Payer: Self-pay | Admitting: Psychiatry

## 2016-04-21 ENCOUNTER — Other Ambulatory Visit: Payer: Self-pay

## 2016-04-21 MED ORDER — OMEPRAZOLE 40 MG PO CPDR: DELAYED_RELEASE_CAPSULE | ORAL | Status: DC

## 2016-04-21 MED ORDER — ATORVASTATIN CALCIUM 40 MG PO TABS: 40.0000 mg | ORAL_TABLET | Freq: Every day | ORAL | Status: DC

## 2016-04-28 DIAGNOSIS — E785 Hyperlipidemia, unspecified: Secondary | ICD-10-CM | POA: Diagnosis not present

## 2016-04-28 DIAGNOSIS — Z79899 Other long term (current) drug therapy: Secondary | ICD-10-CM | POA: Diagnosis not present

## 2016-04-29 LAB — LIPID PANEL
Chol/HDL Ratio: 2.9 ratio units (ref 0.0–4.4)
Cholesterol, Total: 152 mg/dL (ref 100–199)
HDL: 53 mg/dL (ref >39)
LDL Calculated: 79 mg/dL (ref 0–99)
Triglycerides: 98 mg/dL (ref 0–149)
VLDL Cholesterol Cal: 20 mg/dL (ref 5–40)

## 2016-04-29 LAB — HEPATIC FUNCTION PANEL
ALBUMIN: 4.4 g/dL (ref 3.5–5.5)
ALK PHOS: 79 IU/L (ref 39–117)
ALT: 23 IU/L (ref 0–32)
AST: 25 IU/L (ref 0–40)
BILIRUBIN, DIRECT: 0.09 mg/dL (ref 0.00–0.40)
Bilirubin Total: 0.3 mg/dL (ref 0.0–1.2)
TOTAL PROTEIN: 7 g/dL (ref 6.0–8.5)

## 2016-05-03 ENCOUNTER — Encounter (HOSPITAL_COMMUNITY): Payer: Self-pay | Admitting: Psychiatry

## 2016-05-03 ENCOUNTER — Ambulatory Visit: Payer: Medicare Other | Admitting: Family Medicine

## 2016-05-03 ENCOUNTER — Ambulatory Visit (INDEPENDENT_AMBULATORY_CARE_PROVIDER_SITE_OTHER): Payer: Medicare Other | Admitting: Psychiatry

## 2016-05-03 VITALS — BP 160/100 | Ht 68.0 in | Wt 182.0 lb

## 2016-05-03 DIAGNOSIS — F329 Major depressive disorder, single episode, unspecified: Secondary | ICD-10-CM | POA: Insufficient documentation

## 2016-05-03 DIAGNOSIS — F332 Major depressive disorder, recurrent severe without psychotic features: Secondary | ICD-10-CM | POA: Diagnosis not present

## 2016-05-03 MED ORDER — DULOXETINE HCL 60 MG PO CPEP: 60.0000 mg | ORAL_CAPSULE | Freq: Every day | ORAL | Status: DC

## 2016-05-03 MED ORDER — ALPRAZOLAM 1 MG PO TABS: 1.0000 mg | ORAL_TABLET | Freq: Three times a day (TID) | ORAL | Status: DC | PRN
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
## 2016-05-04 ENCOUNTER — Encounter: Payer: Self-pay | Admitting: Family Medicine

## 2016-05-04 ENCOUNTER — Ambulatory Visit (INDEPENDENT_AMBULATORY_CARE_PROVIDER_SITE_OTHER): Payer: Medicare Other | Admitting: Family Medicine

## 2016-05-04 VITALS — BP 128/88 | Ht 68.5 in | Wt 183.2 lb

## 2016-05-04 DIAGNOSIS — E785 Hyperlipidemia, unspecified: Secondary | ICD-10-CM | POA: Diagnosis not present

## 2016-05-04 DIAGNOSIS — R7303 Prediabetes: Secondary | ICD-10-CM

## 2016-05-04 DIAGNOSIS — Z85820 Personal history of malignant melanoma of skin: Secondary | ICD-10-CM | POA: Diagnosis not present

## 2016-05-04 DIAGNOSIS — E669 Obesity, unspecified: Secondary | ICD-10-CM

## 2016-05-04 DIAGNOSIS — I10 Essential (primary) hypertension: Secondary | ICD-10-CM | POA: Diagnosis not present

## 2016-05-04 MED ORDER — HYDROCODONE-ACETAMINOPHEN 5-325 MG PO TABS: 1.0000 | ORAL_TABLET | Freq: Three times a day (TID) | ORAL | Status: DC | PRN

## 2016-05-04 MED ORDER — ATORVASTATIN CALCIUM 40 MG PO TABS: 40.0000 mg | ORAL_TABLET | Freq: Every day | ORAL | Status: DC

## 2016-05-04 MED ORDER — OMEPRAZOLE 40 MG PO CPDR: DELAYED_RELEASE_CAPSULE | ORAL | Status: DC

## 2016-05-04 NOTE — Progress Notes (Signed)
Subjective:    Patient ID: Tara Dickson, female    DOB: 09-17-1961, 55 y.o.   MRN: [id number]  Back Pain This is a chronic problem. The current episode started more than 1 year ago. The problem occurs constantly. The problem has been waxing and waning since onset. The pain is present in the lumbar spine. The quality of the pain is described as burning and aching. The pain does not radiate. The pain is at a severity of 7/10. The pain is moderate. The pain is worse during the day. The symptoms are aggravated by bending, coughing, standing, stress and twisting. Associated symptoms include headaches and leg pain. Pertinent negatives include no abdominal pain, chest pain, dysuria, fever, numbness, paresis or weakness. She has tried analgesics and home exercises for the symptoms. The treatment provided mild relief.  Headache  This is a chronic problem. The current episode started more than 1 month ago. The problem occurs intermittently. The problem has been waxing and waning. The pain is located in the occipital region. The pain does not radiate. The pain quality is similar to prior headaches. The quality of the pain is described as aching and band-like. The pain is at a severity of 4/10. The pain is mild. Associated symptoms include back pain. Pertinent negatives include no abdominal pain, fever, insomnia, muscle aches, nausea, numbness, rhinorrhea, vomiting or weakness. The symptoms are aggravated by unknown. She has tried acetaminophen for the symptoms. The treatment provided mild relief. Her past medical history is significant for hypertension and obesity. There is no history of cluster headaches.    This patient was seen today for chronic pain  The medication list was reviewed and updated.   -Compliance with medication: yes  - Number patient states they take daily: 3 a day for the most part  -when was the last dose patient took? today  The patient was advised the importance of maintaining  medication and not using illegal substances with these.  Refills needed: yes  The patient was educated that we can provide 3 monthly scripts for their medication, it is their responsibility to follow the instructions.  Side effects or complications from medications: none  Patient is aware that pain medications are meant to minimize the severity of the pain to allow their pain levels to improve to allow for better function. They are aware of that pain medications cannot totally remove their pain.  Due for UDT ( at least once per year) : later this year  Pain in right leg, feet stay cold and want to know what is safe to take for headache    Review of Systems  Constitutional: Negative for fever, activity change and appetite change.  HENT: Negative for rhinorrhea.   Cardiovascular: Negative for chest pain.  Gastrointestinal: Negative for nausea, vomiting and abdominal pain.  Genitourinary: Negative for dysuria.  Musculoskeletal: Positive for back pain.  Neurological: Positive for headaches. Negative for weakness and numbness.  Psychiatric/Behavioral: Negative for confusion. The patient does not have insomnia.        Objective:   Physical Exam  Constitutional: She appears well-nourished. No distress.  HENT:  Head: Normocephalic.  Cardiovascular: Normal rate, regular rhythm and normal heart sounds.   No murmur heard. Pulmonary/Chest: Effort normal and breath sounds normal.  Musculoskeletal: She exhibits no edema.  Lymphadenopathy:    She has no cervical adenopathy.  Neurological: She is alert.  Psychiatric: Her behavior is normal.  Vitals reviewed.   righthip area- possible IT band issue-stretching recommended Neuro  exam nl Skin exam no melanoma on back arms legs      Assessment & Plan:  The patient was seen today as part of a comprehensive visit regarding pain control. Patient's compliance with the medication as well as discussion regarding effectiveness was completed.  Prescriptions were written. Patient was advised to follow-up in 3 months. The patient was assessed for any signs of severe side effects. The patient was advised to take the medicine as directed and to report to [place] if any side effect issues. 1. HTN (hypertension), benign Diet meds exercise  2. Hyperlipidemia Great current labs continue meds  3. History of melanoma Skin exam nl as noted above  4. Prediabetes Diet activity check a1c later this year  5. Obesity (BMI 30-39.9) exrecise try to lose weight  Recheck 3 months

## 2016-05-20 ENCOUNTER — Ambulatory Visit (INDEPENDENT_AMBULATORY_CARE_PROVIDER_SITE_OTHER): Payer: Medicare Other | Admitting: Psychology

## 2016-05-20 ENCOUNTER — Encounter (HOSPITAL_COMMUNITY): Payer: Self-pay | Admitting: *Deleted

## 2016-05-31 ENCOUNTER — Ambulatory Visit (INDEPENDENT_AMBULATORY_CARE_PROVIDER_SITE_OTHER): Payer: Medicare Other | Admitting: Psychiatry

## 2016-05-31 ENCOUNTER — Encounter (HOSPITAL_COMMUNITY): Payer: Self-pay | Admitting: Psychiatry

## 2016-05-31 VITALS — BP 109/83 | HR 73 | Ht 68.0 in | Wt 181.4 lb

## 2016-05-31 DIAGNOSIS — F332 Major depressive disorder, recurrent severe without psychotic features: Secondary | ICD-10-CM

## 2016-05-31 MED ORDER — DULOXETINE HCL 60 MG PO CPEP: 60.0000 mg | ORAL_CAPSULE | Freq: Every day | ORAL | Status: DC

## 2016-05-31 MED ORDER — SUVOREXANT 20 MG PO TABS: 20.0000 mg | ORAL_TABLET | Freq: Every day | ORAL | Status: DC
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
## 2016-06-04 ENCOUNTER — Other Ambulatory Visit: Payer: Self-pay | Admitting: Family Medicine

## 2016-06-24 ENCOUNTER — Other Ambulatory Visit: Payer: Self-pay | Admitting: Family Medicine

## 2016-06-25 ENCOUNTER — Telehealth: Payer: Self-pay | Admitting: *Deleted

## 2016-06-25 MED ORDER — OMEPRAZOLE 40 MG PO CPDR: DELAYED_RELEASE_CAPSULE | ORAL | Status: DC

## 2016-06-25 NOTE — Telephone Encounter (Signed)
Left message to return call 

## 2016-06-25 NOTE — Telephone Encounter (Signed)
Dr Nicki Reaper received letter from [facility] stating that the patient is filling both Pravastatin 80 mg thru [facility] mail order and Atorvastatin 40mg  from [facility]. Dr Nicki Reaper states the patient should only be taking the Atorvastatin and NOT the pravastatin. Dr Nicki Reaper states the patient should be aware of this but would like to confirm she has not been taking both prescription and is only taking the Atorvastatin.

## 2016-06-25 NOTE — Telephone Encounter (Signed)
Discussed with patient. Patient advised that she has not been taking both of the medications. Patient states she had just filled a 90 day supply of Pravastatin 80 thru mail order when she was switched to the Atorvastatin 40mg  and she disposed of the pravastatin and is only taking the Atorvastatin as prescribed.

## 2016-06-25 NOTE — Addendum Note (Signed)
Addended by: Dairl Ponder on: 06/25/2016 09:46 AM   Modules accepted: Orders

## 2016-07-12 ENCOUNTER — Encounter (HOSPITAL_COMMUNITY): Payer: Self-pay | Admitting: Psychiatry

## 2016-07-12 ENCOUNTER — Ambulatory Visit (INDEPENDENT_AMBULATORY_CARE_PROVIDER_SITE_OTHER): Payer: Medicare Other | Admitting: Psychiatry

## 2016-07-12 VITALS — BP 142/92 | HR 74 | Ht 68.0 in | Wt 185.6 lb

## 2016-07-12 DIAGNOSIS — F332 Major depressive disorder, recurrent severe without psychotic features: Secondary | ICD-10-CM | POA: Diagnosis not present

## 2016-07-12 MED ORDER — SUVOREXANT 20 MG PO TABS: 10.0000 mg | ORAL_TABLET | Freq: Every day | ORAL | 2 refills | Status: DC

## 2016-07-12 MED ORDER — DULOXETINE HCL 60 MG PO CPEP: 60.0000 mg | ORAL_CAPSULE | Freq: Every day | ORAL | 2 refills | Status: DC

## 2016-07-12 MED ORDER — ALPRAZOLAM 1 MG PO TABS: 1.0000 mg | ORAL_TABLET | Freq: Three times a day (TID) | ORAL | 2 refills | Status: DC | PRN
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
## 2016-08-04 ENCOUNTER — Ambulatory Visit: Payer: Medicare Other | Admitting: Family Medicine

## 2016-08-05 ENCOUNTER — Ambulatory Visit (INDEPENDENT_AMBULATORY_CARE_PROVIDER_SITE_OTHER): Payer: Medicare Other | Admitting: Family Medicine

## 2016-08-05 ENCOUNTER — Encounter: Payer: Self-pay | Admitting: Family Medicine

## 2016-08-05 VITALS — Ht 68.5 in | Wt 182.0 lb

## 2016-08-05 DIAGNOSIS — E785 Hyperlipidemia, unspecified: Secondary | ICD-10-CM

## 2016-08-05 DIAGNOSIS — I10 Essential (primary) hypertension: Secondary | ICD-10-CM

## 2016-08-05 DIAGNOSIS — Z79891 Long term (current) use of opiate analgesic: Secondary | ICD-10-CM

## 2016-08-05 DIAGNOSIS — Z1231 Encounter for screening mammogram for malignant neoplasm of breast: Secondary | ICD-10-CM | POA: Diagnosis not present

## 2016-08-05 DIAGNOSIS — M545 Low back pain: Secondary | ICD-10-CM

## 2016-08-05 DIAGNOSIS — R5383 Other fatigue: Secondary | ICD-10-CM

## 2016-08-05 DIAGNOSIS — G8929 Other chronic pain: Secondary | ICD-10-CM

## 2016-08-05 MED ORDER — HYDROCODONE-ACETAMINOPHEN 5-325 MG PO TABS: 1.0000 | ORAL_TABLET | Freq: Three times a day (TID) | ORAL | 0 refills | Status: DC | PRN

## 2016-08-05 NOTE — Patient Instructions (Signed)
Narcotic medication treatment agreement-educational material and consent form. Your health problem-because you are having problems with pain you are being prescribed narcotic medication to help control your pain. The purpose of narcotic medication-narcotics are a type of drug that should help you with your pain and let you be more active in your daily life. It is not expected that your pain will go away completely. There are risks associated with these drugs and you can also have side effects. It is important for you to be honest with your doctor about your pain and the dose of medication you are taking. It is also important to be honest with your doctor about any potential problems or side effects with the medications that you are having. Risk and common problems-narcotic use has been associated with the following issues Addiction- there is a chance that you could become addicted to narcotic drugs. This means that you once the drug and will try very hard to get it, even if it causes you harm or other problems in your life. This chance is greater in people who are young, have mental illness, have been addicted to any drug in the past, or have a close relative that has been addicted to a drug in the past. Your doctor may tell you that you need tests or should see other health providers to help you avoid addiction. Allergic reaction - all kinds of allergic reactions can happen. You could have a minor reaction such as a rash or severe reaction such as swelling of your tongue or throat. A severe reaction as a medical emergency that can cause death. Incomplete relief of pain - narcotic medications do not take away all of your pain. Your doctor will work with you to try to optimize treatment but it is not reasonable to expect complete relief of pain. Low testosterone levels in men - narcotic drugs may cause the levels of the hormone testosterone to drop in men. This could change your mood and energy level. It may  also lessen the desire to have sex. Testosterone supplementation in this situation is not recommended. Physical dependence- you may not feel well if your dose is suddenly stopped. Some common symptoms of withdraw are runny nose, excessive yawning, goosebumps, nausea with stomach pains, diarrhea, body aches, and increased irritability. Side effects- there are many side effects of narcotic drugs. Constipation, nausea, vomiting, itching, dizziness are all potential side effects. Slowed breathing- excessive doses of narcotics can slow your breathing. Do not use other drugs or drink alcohol while taking narcotic drugs. This can cause accidental death. Follow directions on how the medication is prescribed. If you feel you are having problems then notify your doctor. Slowed reaction time- you may feel sleepy and be slow to react. If this happens, then you should not drive, use heavy machinery or guns, or be at unsafe heights, or be caring for someone else. Tolerance- your body could become use to the dose of narcotic drugs that your doctor tells you to take, and you may not get the same relief of pain that you had before. A higher dose may not help and could cause potential side effects. Increased risk of accidental death can occur due to the narcotic medication or side effects. If you are having any of the problems listed above you need to discuss these with your physician.  Other choices- you do not have to take narcotics. The decision to take narcotics for pain his ureters. There are other choices that you may choose.  There are several alternatives that may be helpful. These can be done in place of narcotics or in some cases along with narcotics. - Anti-inflammatories, antidepressants, and seizure medications can be helpful -Physical therapy, wearing a brace, surgical referral, home exercise regimens, could potentially help -Referral to a specialist-you may wish to see a specialist who specializes in pain  management -You can choose to do nothing and live with the pain you have -Your doctor will discuss with you your choices but the decision his ureters. How well any other treatment works will depend on your specific health problem. More facts-there may be local, steak, or federal laws that your doctor must follow with prescribing narcotic drugs. It is not clear if narcotic painkillers are good for you to take for a long period of time. You should discuss with your doctor often about the good and bad effects that these drugs may have on you.  You should not take narcotic drugs if you are pregnant. If you become pregnant notify your doctor right away. Narcotic drugs can raise a chance of having a miscarriage or having a baby born with a birth defect. Your baby can also be born addicted to the drug. Should you become pregnant you will have to discontinue narcotic use. Treatment agreement - by signing the consent form you agree that you understand the rules for taking narcotic drugs. If you do not follow these rules, then your doctor may refer you to a specialist, no longer prescribe pain medications for you, and release you/terminate you from his or her care. Drug safety-you must lock your drugs in a safe place. They must be kept away from children. We will also were review with you the right way to get rid of any extra drugs.  You may not sell, share, or let other people use your drugs. This is a crime and can cause overdoses. You may be asked to come to our office between scheduled appointments for random pill counts. Failure to comply with this will result in dismissal. Instructions for taking narcotic drugs-you are only to take pain medications that is prescribed by our office. Do not combine your pain medication with other physician narcotic pain medications or other peoples pain medications. Do not stop taking your narcotic suddenly.  Do not drive after a new pain drug is started or after a dose is  increased until you are sure it does not make you sleepy or confused. Do not try to cut or crush your drug unless told to do so. This could cause death. Your drug will be stopped if it is not helping enough or if it is showing signs of harm to you. You must tell your doctor about any new drugs or health problems. Your drug may not work well or may work differently if you have certain health problems.  You must tell your doctor about problems you have with any drugs prescribed or illegal. If you feel you're having an addiction problem with prescribed or illegal drugs you will discuss this with your doctor in order to be referred for treatment.  Your doctor reserves the right to limit other medications that can interact with pain medications. Prescriptions and refills- your narcotic drugs will be prescribed by our office only. You may not ask for pain drugs from any other doctors including emergency room doctors. Our office will decide how many refills you will be given and how often he will need to be seen in our office in order to get  them area at the very least every 3 month appointments are required. Never try to change a prescription. If you do this then it will be reported to the police. You will also be terminated from the practice. Your prescription will not be replaced if lost, stolen, or destroyed by accident. Patients on regular narcotics must keep their office visits on a regular basis-always every 3 months or less. It is at that appointment they will receive their prescriptions. Do not call for an additional month supply. An office visit is necessary. Appointments- you will keep all appointments with doctors, therapist, and counselors. If you miss your scheduled appointments often, then your doctor may slowly decrease your dose of narcotic drugs until you are no longer taking it. The dates when your prescription was filled at the pharmacy will be per 5. The state wide database will be checked at  standard visits to make sure the patient is not receiving pain prescriptions from other physicians.  Random drug testing for illegal substances will be standard. Your doctor may have you give urine, blood, hair, or saliva to run tests. If you have test results that are not normal then your doctor may slowly decrease your dose of narcotic drugs until you are no longer taking the medication or stop prescribing additional pain medications immediately. Refusal to do urine drug testing is basis for the practice no longer prescribe narcotic pain medications. Pain management specialist If your provider feels it is in your best interest to see a pain medicine specialist we will advise you have such and help you with the referral. Sometimes this referral is made because standard dosing of short acting medication is no longer keeping the patient's pain under reasonable control. Sometimes this is based upon a patient's health issue, and it is felt that pain management would best serve the patient's needs.  Once under the care of pain management we will no longer be prescribing the narcotic medications. This will be under the guidance of the pain management specialist. Further pain medication prescriptions will not be reassumed by this office once referred to pain management. In these situations we will continue to provide primary care but not pain medications.  Violations of the pain management agreement will result in our office no longer prescribing pain medications. In some situations it will also result in dismissal of the patient from our practice.  Health information-your doctor may need to discuss your treatment with pharmacists or other providers. Legal authorities may ask for your pain treatment records. If this happens then records will be given to them up on proper documentation/release.  You should also be aware that properly taking pain medications is very important in regards to operating a vehicle.  Even with following proper prescriptions instructions it is possible to be charged with operating a vehicle under influence. It is highly important that if you feel drowsy or drug that you do not operate a motor vehicle for the safety of yourself and others.

## 2016-08-05 NOTE — Progress Notes (Signed)
Subjective:    Patient ID: Tara Dickson, female    DOB: 08-30-1961, 55 y.o.   MRN: [id number]  HPI This patient was seen today for chronic pain  The medication list was reviewed and updated.   -Compliance with medication: Patient states she takes 2 or 3 times per day she states compliance she does admit to smoking marijuana intermittently  - Number patient states they take daily: 2 -3 times  -when was the last dose patient took? yesterday  The patient was advised the importance of maintaining medication and not using illegal substances with these.  Refills needed: yes  The patient was educated that we can provide 3 monthly scripts for their medication, it is their responsibility to follow the instructions.  Side effects or complications from medications: None  Patient is aware that pain medications are meant to minimize the severity of the pain to allow their pain levels to improve to allow for better function. They are aware of that pain medications cannot totally remove their pain.  Due for UDT ( at least once per year) : due today  Pt wants to discuss sex drive. She lost boyfriend because she did not want to have sex. Pt states she talked with dr Harrington Challenger and she was told to discuss with family physician.   I talked at length about her lack of sexual interests I believe that this is partly related to her chronic pain as well as getting older and psychiatric medicines and just normal bodily changes  I do not recommend any testing in this area or adding any medicines  Greater and have to time today was spent discussing safety of medications. The patient doesn't meant to smoking marijuana I told her that she truly should avoid this and that she is not to drive or operate any dangerous equipment if she is feeling the effects of any medications or marijuana  Patient is under the care of psychiatry. Her psychiatrist has her on nerve medication.  Patient also has cholesterol issues  previous lab work reviewed with patient patient takes her medicine watch his diet She does take her heart medicine as well as her 81 mg aspirin and she does not denies any type of chest pressure or shortness of breath   Lab work indicated and ordered today    Review of Systems  Constitutional: Negative for activity change and appetite change.  Gastrointestinal: Negative for abdominal pain and vomiting.  Musculoskeletal: Positive for arthralgias and back pain.  Neurological: Negative for weakness.  Psychiatric/Behavioral: Negative for confusion.       Objective:   Physical Exam  Constitutional: She appears well-nourished. No distress.  HENT:  Head: Normocephalic.  Cardiovascular: Normal rate, regular rhythm and normal heart sounds.   No murmur heard. Pulmonary/Chest: Effort normal and breath sounds normal.  Musculoskeletal: She exhibits no edema.  Lymphadenopathy:    She has no cervical adenopathy.  Neurological: She is alert.  Psychiatric: Her behavior is normal.  Vitals reviewed.    25 minutes was spent with the patient. Greater than half the time was spent in discussion and answering questions and counseling regarding the issues that the patient came in for today.     Assessment & Plan:  1. Encounter for long-term opiate analgesic use Urine drug screen collected. Patient is on Xanax through her psychologist psychiatry care patient was counseled to try to reduce her Xanax gradually, may continue pain medication, patient caution pain medicine and Xanax can interact and increased risk of  accidental overdose - ToxASSURE Select 13 (MW), Urine  2. HTN (hypertension), benign Blood pressure good control continue current measures watch diet closely - Basic metabolic panel  3. Hyperlipidemia Continue cholesterol medicine previous lab work reviewed Work ordered - Lipid panel  4. Chronic lumbar pain Chronic lumbar pain 5 mg hydrocodone no greater than 3 times daily as  needed  5. Other fatigue Significant fatigue and tiredness has history of melanoma but no reoccurrence check lab work - CBC with Differential/Platelet - Basic metabolic panel - TSH - T4, free  6. Encounter for screening mammogram for breast cancer Mammogram recommended - MM DIGITAL SCREENING BILATERAL

## 2016-08-05 NOTE — Progress Notes (Signed)
Subjective:     Patient ID: Tara Dickson, female   DOB: 06-14-1961, 55 y.o.   MRN: [id number]  HPI   Review of Systems     Objective:   Physical Exam     Assessment:         Plan:

## 2016-08-15 LAB — TOXASSURE SELECT 13 (MW), URINE: PDF: 0

## 2016-08-16 ENCOUNTER — Ambulatory Visit (HOSPITAL_COMMUNITY): Payer: Self-pay

## 2016-09-02 DIAGNOSIS — W540XXA Bitten by dog, initial encounter: Secondary | ICD-10-CM | POA: Diagnosis not present

## 2016-09-02 DIAGNOSIS — Z23 Encounter for immunization: Secondary | ICD-10-CM | POA: Diagnosis not present

## 2016-09-02 DIAGNOSIS — S81011A Laceration without foreign body, right knee, initial encounter: Secondary | ICD-10-CM | POA: Diagnosis not present

## 2016-09-02 DIAGNOSIS — Z87891 Personal history of nicotine dependence: Secondary | ICD-10-CM | POA: Diagnosis not present

## 2016-09-02 DIAGNOSIS — S81051A Open bite, right knee, initial encounter: Secondary | ICD-10-CM | POA: Diagnosis not present

## 2016-09-02 DIAGNOSIS — Z79899 Other long term (current) drug therapy: Secondary | ICD-10-CM | POA: Diagnosis not present

## 2016-09-02 DIAGNOSIS — Z85820 Personal history of malignant melanoma of skin: Secondary | ICD-10-CM | POA: Diagnosis not present

## 2016-09-06 ENCOUNTER — Telehealth (HOSPITAL_COMMUNITY): Payer: Self-pay | Admitting: *Deleted

## 2016-09-06 NOTE — Telephone Encounter (Signed)
Called pt to resch appt for Sept 21 due to provider not being in office at the time of visit. Lmtcb and office number provided.

## 2016-09-06 NOTE — Telephone Encounter (Signed)
got disconnected before appointment could be scheduled.  rescheduled from 09/10/16.   left voice message for patient to call to schedule.

## 2016-09-09 ENCOUNTER — Ambulatory Visit (HOSPITAL_COMMUNITY): Payer: Self-pay | Admitting: Psychiatry

## 2016-10-14 ENCOUNTER — Ambulatory Visit (INDEPENDENT_AMBULATORY_CARE_PROVIDER_SITE_OTHER): Payer: Medicare Other | Admitting: Psychiatry

## 2016-10-14 ENCOUNTER — Encounter (HOSPITAL_COMMUNITY): Payer: Self-pay | Admitting: Psychiatry

## 2016-10-14 VITALS — BP 153/101 | HR 68 | Ht 68.5 in | Wt 188.0 lb

## 2016-10-14 DIAGNOSIS — F332 Major depressive disorder, recurrent severe without psychotic features: Secondary | ICD-10-CM | POA: Diagnosis not present

## 2016-10-14 DIAGNOSIS — Z818 Family history of other mental and behavioral disorders: Secondary | ICD-10-CM

## 2016-10-14 DIAGNOSIS — Z8489 Family history of other specified conditions: Secondary | ICD-10-CM | POA: Diagnosis not present

## 2016-10-14 DIAGNOSIS — Z8249 Family history of ischemic heart disease and other diseases of the circulatory system: Secondary | ICD-10-CM | POA: Diagnosis not present

## 2016-10-14 DIAGNOSIS — Z79899 Other long term (current) drug therapy: Secondary | ICD-10-CM

## 2016-10-14 DIAGNOSIS — Z7982 Long term (current) use of aspirin: Secondary | ICD-10-CM

## 2016-10-14 DIAGNOSIS — Z87891 Personal history of nicotine dependence: Secondary | ICD-10-CM

## 2016-10-14 MED ORDER — ALPRAZOLAM 1 MG PO TABS: 1.0000 mg | ORAL_TABLET | Freq: Three times a day (TID) | ORAL | 2 refills | Status: DC | PRN

## 2016-10-14 MED ORDER — SUVOREXANT 5 MG PO TABS: 5.0000 mg | ORAL_TABLET | Freq: Every day | ORAL | 2 refills | Status: DC

## 2016-10-14 MED ORDER — DULOXETINE HCL 60 MG PO CPEP: 60.0000 mg | ORAL_CAPSULE | Freq: Every day | ORAL | 2 refills | Status: DC
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
## 2016-11-01 DIAGNOSIS — R5383 Other fatigue: Secondary | ICD-10-CM | POA: Diagnosis not present

## 2016-11-01 DIAGNOSIS — I10 Essential (primary) hypertension: Secondary | ICD-10-CM | POA: Diagnosis not present

## 2016-11-01 DIAGNOSIS — E785 Hyperlipidemia, unspecified: Secondary | ICD-10-CM | POA: Diagnosis not present

## 2016-11-02 LAB — LIPID PANEL
CHOL/HDL RATIO: 3.6 ratio (ref 0.0–4.4)
CHOLESTEROL TOTAL: 149 mg/dL (ref 100–199)
HDL: 41 mg/dL (ref >39)
LDL Calculated: 90 mg/dL (ref 0–99)
TRIGLYCERIDES: 92 mg/dL (ref 0–149)
VLDL Cholesterol Cal: 18 mg/dL (ref 5–40)

## 2016-11-02 LAB — CBC WITH DIFFERENTIAL/PLATELET
BASOS ABS: 0 10*3/uL (ref 0.0–0.2)
BASOS: 0 %
EOS (ABSOLUTE): 0.2 10*3/uL (ref 0.0–0.4)
Eos: 4 %
HEMATOCRIT: 34.7 % (ref 34.0–46.6)
HEMOGLOBIN: 11.4 g/dL (ref 11.1–15.9)
Immature Grans (Abs): 0 10*3/uL (ref 0.0–0.1)
Immature Granulocytes: 0 %
LYMPHS ABS: 1.3 10*3/uL (ref 0.7–3.1)
Lymphs: 24 %
MCH: 29.8 pg (ref 26.6–33.0)
MCHC: 32.9 g/dL (ref 31.5–35.7)
MCV: 91 fL (ref 79–97)
MONOCYTES: 9 %
Monocytes Absolute: 0.5 10*3/uL (ref 0.1–0.9)
NEUTROS ABS: 3.4 10*3/uL (ref 1.4–7.0)
Neutrophils: 63 %
Platelets: 153 10*3/uL (ref 150–379)
RBC: 3.82 x10E6/uL (ref 3.77–5.28)
RDW: 13.4 % (ref 12.3–15.4)
WBC: 5.5 10*3/uL (ref 3.4–10.8)

## 2016-11-02 LAB — BASIC METABOLIC PANEL
BUN / CREAT RATIO: 9 (ref 9–23)
BUN: 9 mg/dL (ref 6–24)
CO2: 26 mmol/L (ref 18–29)
CREATININE: 1.02 mg/dL — AB (ref 0.57–1.00)
Calcium: 9.5 mg/dL (ref 8.7–10.2)
Chloride: 103 mmol/L (ref 96–106)
GFR, EST AFRICAN AMERICAN: 71 mL/min/{1.73_m2} (ref >59)
GFR, EST NON AFRICAN AMERICAN: 62 mL/min/{1.73_m2} (ref >59)
GLUCOSE: 92 mg/dL (ref 65–99)
Potassium: 3.6 mmol/L (ref 3.5–5.2)
SODIUM: 143 mmol/L (ref 134–144)

## 2016-11-02 LAB — T4, FREE: Free T4: 1.02 ng/dL (ref 0.82–1.77)

## 2016-11-02 LAB — TSH: TSH: 0.703 u[IU]/mL (ref 0.450–4.500)

## 2016-11-05 ENCOUNTER — Ambulatory Visit (INDEPENDENT_AMBULATORY_CARE_PROVIDER_SITE_OTHER): Payer: Medicare Other | Admitting: Family Medicine

## 2016-11-05 ENCOUNTER — Encounter: Payer: Self-pay | Admitting: Family Medicine

## 2016-11-05 VITALS — BP 132/80 | Ht 69.0 in | Wt 186.0 lb

## 2016-11-05 DIAGNOSIS — Z79891 Long term (current) use of opiate analgesic: Secondary | ICD-10-CM

## 2016-11-05 MED ORDER — HYDROCODONE-ACETAMINOPHEN 5-325 MG PO TABS: 1.0000 | ORAL_TABLET | Freq: Two times a day (BID) | ORAL | 0 refills | Status: DC | PRN

## 2016-11-05 NOTE — Progress Notes (Signed)
   Subjective:    Patient ID: Tara Dickson, female    DOB: December 26, 1960, 55 y.o.   MRN: [id number]  HPIpt arrives to discuss back pain. Failed last drug screen. Not taking any pain meds except some she gets from her boyfriend. She relates over the past 3 months she is taken approximately 3 hydrocodone from her boyfriend. She states in the past she was very faithful with taken her medicine. She states she was having a headache at a friend's house and they gave her some medicine for it she found out later that it was a nerve medication that showed up in her urine drug screen. She denies abusing medication. She states she is not abusing street drugs. She would like consideration to be back on pain medicine. She states she will not abuse it. She has had several years with [place] where she is faithfully taking the medicine.    She does relate low back pain discomfort that radiates into the legs worse on the left side than the right side she does not one to do any MRI injections or visits with neurosurgeons and does not one physical therapy.  Review of Systems She denies any chest tightness pressure pain shortness breath nausea vomiting diarrhea    Objective:   Physical Exam Lungs clear heart regular low back moderate subjective tenderness increased pain with straight leg raise       Assessment & Plan:  Chronic low back pain Failed urine drug screen 3 months ago Patient states that she is not abusing medicines please see discussion above 1 prescription was given to her for pain medication use she will follow-up in a proximally 2-3 weeks for a pill count any urine drug screen.

## 2016-11-05 NOTE — Patient Instructions (Signed)
Bring pill bottle with you in 2 weeks

## 2016-11-18 ENCOUNTER — Ambulatory Visit (INDEPENDENT_AMBULATORY_CARE_PROVIDER_SITE_OTHER): Payer: Medicare Other | Admitting: Family Medicine

## 2016-11-18 ENCOUNTER — Encounter: Payer: Self-pay | Admitting: Family Medicine

## 2016-11-18 VITALS — BP 124/88 | Ht 68.5 in | Wt 184.0 lb

## 2016-11-18 DIAGNOSIS — Z79891 Long term (current) use of opiate analgesic: Secondary | ICD-10-CM

## 2016-11-18 MED ORDER — HYDROCODONE-ACETAMINOPHEN 5-325 MG PO TABS: 1.0000 | ORAL_TABLET | Freq: Two times a day (BID) | ORAL | 0 refills | Status: DC | PRN

## 2016-11-18 NOTE — Progress Notes (Signed)
   Subjective:    Patient ID: Tara Dickson, female    DOB: 07/25/1961, 55 y.o.   MRN: [id number]  HPI This patient was seen today for chronic pain  The medication list was reviewed and updated.   -Compliance with medication: yes  - Number patient states they take daily: two a day  -when was the last dose patient took? This am  The patient was advised the importance of maintaining medication and not using illegal substances with these.  Refills needed: yes  The patient was educated that we can provide 3 monthly scripts for their medication, it is their responsibility to follow the instructions.  Side effects or complications from medications: none  Patient is aware that pain medications are meant to minimize the severity of the pain to allow their pain levels to improve to allow for better function. They are aware of that pain medications cannot totally remove their pain.  Due for UDT ( at least once per year) : due today for urine drug screen and pill count. Pt brought in med but forgot to take 3 days worth of pills out of pill planner.   Concerns about dizziness for 2 -3 days. Has not been able to drive. Patient states that she feels low bit unsteady at times little dizzy she does not think it's her medication. She relates low but a head congestion denies any unilateral numbness or weakness.       Review of Systems Patient denies any chest tightness pressure pain denies fever chills cough wheezing vomiting.    Objective:   Physical Exam HEENT is benign neck no masses finger to nose normal Romberg negative. Lungs clear heart regular       Assessment & Plan:  The importance of sticking with her medications were discussed in detail her pill count is accurate. One prescription given to will be held until the other urine drug screen his back  I have encouraged patient to avoid smoking marijuana. She does do this recreationally states only at home and never when she is  driving  She is trying to cut back on her Xanax use. She has been warned about the possibility of drug interactions drowsiness as well as the risk of accidents and injuries and accidental overdose  The patient does have some transient dizziness the past couple days possibly related to a head cold if that progresses or states persistent he'll need further workup  1 prescription was given to the patient to prescriptions will be held until her urine drug screen comes back

## 2016-11-24 LAB — TOXASSURE SELECT 13 (MW), URINE

## 2016-12-12 ENCOUNTER — Other Ambulatory Visit: Payer: Self-pay | Admitting: Family Medicine

## 2016-12-29 ENCOUNTER — Other Ambulatory Visit (HOSPITAL_COMMUNITY): Payer: Self-pay | Admitting: Psychiatry

## 2016-12-30 ENCOUNTER — Telehealth: Payer: Self-pay | Admitting: Family Medicine

## 2016-12-30 DIAGNOSIS — L600 Ingrowing nail: Secondary | ICD-10-CM

## 2016-12-30 MED ORDER — CEPHALEXIN 500 MG PO CAPS: 500.0000 mg | ORAL_CAPSULE | Freq: Four times a day (QID) | ORAL | 0 refills | Status: AC

## 2016-12-30 NOTE — Telephone Encounter (Signed)
Keflex 500 mg 1 4 times a day for 7 days, I also recommend referral to podiatry for ingrown toenail

## 2016-12-30 NOTE — Telephone Encounter (Signed)
Patient has had an ingrown toenail for about 2 days. She is requesting something to be called in.    [place]

## 2016-12-30 NOTE — Telephone Encounter (Signed)
LMTCB

## 2016-12-30 NOTE — Telephone Encounter (Signed)
I spoke with patient. She c/o ingrown toenail (R great toe) x 2 days.  She c/o pain, tender to touch, and pain w/ walking.  She denies swelling, drainage, redness, and fever.  She states she has soaked in warm epsom salt water and used OTC fungal treatment w/o relief.  Pt admits to h/o ingrown toenails, none this severe. Please advise.

## 2016-12-31 ENCOUNTER — Telehealth: Payer: Self-pay | Admitting: Family Medicine

## 2016-12-31 NOTE — Telephone Encounter (Signed)
Referral done & patient notified Also notified pt of antibiotics that we sent in for her yesterday

## 2016-12-31 NOTE — Telephone Encounter (Signed)
Pt needs a refill for 90 days on her carvedilol (COREG) 3.125 MG tablet   Her mail order pharmacy is [facility]  Please call when done

## 2016-12-31 NOTE — Telephone Encounter (Signed)
Thank you :)

## 2017-01-13 ENCOUNTER — Ambulatory Visit (HOSPITAL_COMMUNITY): Payer: Self-pay | Admitting: Psychiatry

## 2017-01-21 ENCOUNTER — Other Ambulatory Visit: Payer: Self-pay | Admitting: Family Medicine

## 2017-02-01 ENCOUNTER — Telehealth (HOSPITAL_COMMUNITY): Payer: Self-pay | Admitting: *Deleted

## 2017-02-01 NOTE — Telephone Encounter (Signed)
Please call her and see if she plans to come back before we send in refill 

## 2017-02-01 NOTE — Telephone Encounter (Signed)
[facility] mail order is requesting refills for [name]. Pt medication last refilled on 10-14-2016 with 30 tabs 2 refills. Pt was last seen on 10-14-2016 and do not have f/u appt. Pharmacy number is [phone] with ref number [id number].

## 2017-02-02 ENCOUNTER — Ambulatory Visit (INDEPENDENT_AMBULATORY_CARE_PROVIDER_SITE_OTHER): Payer: Medicare Other | Admitting: Family Medicine

## 2017-02-02 ENCOUNTER — Encounter: Payer: Self-pay | Admitting: Family Medicine

## 2017-02-02 VITALS — BP 120/80 | Ht 68.5 in | Wt 175.0 lb

## 2017-02-02 DIAGNOSIS — E784 Other hyperlipidemia: Secondary | ICD-10-CM

## 2017-02-02 DIAGNOSIS — E7849 Other hyperlipidemia: Secondary | ICD-10-CM

## 2017-02-02 DIAGNOSIS — Z79891 Long term (current) use of opiate analgesic: Secondary | ICD-10-CM

## 2017-02-02 DIAGNOSIS — R5383 Other fatigue: Secondary | ICD-10-CM

## 2017-02-02 DIAGNOSIS — I10 Essential (primary) hypertension: Secondary | ICD-10-CM

## 2017-02-02 MED ORDER — HYDROCODONE-ACETAMINOPHEN 5-325 MG PO TABS: 1.0000 | ORAL_TABLET | Freq: Two times a day (BID) | ORAL | 0 refills | Status: DC | PRN

## 2017-02-02 MED ORDER — LEVETIRACETAM 500 MG PO TABS: ORAL_TABLET | ORAL | 3 refills | Status: DC

## 2017-02-02 MED ORDER — LISINOPRIL 10 MG PO TABS: 10.0000 mg | ORAL_TABLET | Freq: Every day | ORAL | 3 refills | Status: DC

## 2017-02-02 MED ORDER — ATORVASTATIN CALCIUM 40 MG PO TABS: 40.0000 mg | ORAL_TABLET | Freq: Every day | ORAL | 3 refills | Status: DC

## 2017-02-02 NOTE — Progress Notes (Signed)
   Subjective:    Patient ID: Tara Dickson, female    DOB: 19-Jun-1961, 56 y.o.   MRN: [id number]  HPI This patient was seen today for chronic pain  The medication list was reviewed and updated.   -Compliance with medication: Takes daily  - Number patient states they take daily: Takes 2 daily  -when was the last dose patient took? This morning.  The patient was advised the importance of maintaining medication and not using illegal substances with these.  Refills needed: Yes   The patient was educated that we can provide 3 monthly scripts for their medication, it is their responsibility to follow the instructions.  Side effects or complications from medications: None   Patient is aware that pain medications are meant to minimize the severity of the pain to allow their pain levels to improve to allow for better function. They are aware of that pain medications cannot totally remove their pain.  Due for UDT ( at least once per year) : 10/2017 Patient follows up with psychiatry they have her on Xanax for her nerves also have ROM antidepressant Patient also uses her blood pressure medicine regular basis Takes her reflux medicine regular basis Denies abusing pain medicine     Patient has c/o of dizziness. Onset a few days ago. This dizziness is more so when she stands up.  Patient denies any chest pressure tightness sweats chills denies PND orthopnea denies swelling in the legs She states her reflux is under good control with her medicines tries watch her diet      Review of Systems  Constitutional: Negative for activity change and appetite change.  Gastrointestinal: Negative for abdominal pain and vomiting.  Neurological: Negative for weakness.  Psychiatric/Behavioral: Negative for confusion.       Objective:   Physical Exam  Constitutional: She appears well-nourished. No distress.  HENT:  Head: Normocephalic.  Cardiovascular: Normal rate, regular rhythm and normal  heart sounds.   No murmur heard. Pulmonary/Chest: Effort normal and breath sounds normal.  Musculoskeletal: She exhibits no edema.  Lymphadenopathy:    She has no cervical adenopathy.  Neurological: She is alert.  Psychiatric: Her behavior is normal.  Vitals reviewed.  Blood pressure was checked a little bit on the low side but not Amer normally so  25 minutes was spent with the patient. Greater than half the time was spent in discussion and answering questions and counseling regarding the issues that the patient came in for today.      Assessment & Plan:  Blood pressure borderline low reduce Coreg recommend one half tablet twice daily. Patient to follow-up 3 months-continue lisinopril at current dose  Hyperlipidemia previous labs reviewed continue current medications.  Reflux good control continue Prilosec  Continue health care with psychiatrist  Chronic pain and discomfort-under good control with current medication for her neck and back. Patient does not abuse her medicine does not use more than 2 per day  With this patient's complex health issues of depression, chronic anxiety, chronic pain I do not feel this patient is able to work. She is been disabling continues to be disabled. I have advised her not to work.

## 2017-02-03 ENCOUNTER — Ambulatory Visit: Payer: Medicare Other | Admitting: Family Medicine

## 2017-02-03 MED ORDER — CARVEDILOL 3.125 MG PO TABS: 1.5600 mg | ORAL_TABLET | Freq: Two times a day (BID) | ORAL | 0 refills | Status: DC

## 2017-02-03 NOTE — Progress Notes (Signed)
Med list updated

## 2017-02-03 NOTE — Addendum Note (Signed)
Addended by: Dairl Ponder on: 02/03/2017 11:15 AM   Modules accepted: Orders

## 2017-02-03 NOTE — Telephone Encounter (Signed)
Pt stated she plans on coming back to see provider and pt resch appt.

## 2017-03-03 ENCOUNTER — Ambulatory Visit (HOSPITAL_COMMUNITY): Payer: Self-pay | Admitting: Psychiatry

## 2017-03-08 ENCOUNTER — Other Ambulatory Visit: Payer: Self-pay | Admitting: Family Medicine

## 2017-03-11 ENCOUNTER — Other Ambulatory Visit: Payer: Self-pay | Admitting: Family Medicine

## 2017-04-26 ENCOUNTER — Other Ambulatory Visit (HOSPITAL_COMMUNITY): Payer: Self-pay | Admitting: Psychiatry

## 2017-04-26 ENCOUNTER — Other Ambulatory Visit: Payer: Self-pay | Admitting: Family Medicine

## 2017-05-02 ENCOUNTER — Ambulatory Visit (INDEPENDENT_AMBULATORY_CARE_PROVIDER_SITE_OTHER): Payer: Medicare Other | Admitting: Family Medicine

## 2017-05-02 ENCOUNTER — Encounter: Payer: Self-pay | Admitting: Family Medicine

## 2017-05-02 VITALS — BP 130/82 | Ht 68.5 in | Wt 176.4 lb

## 2017-05-02 DIAGNOSIS — E784 Other hyperlipidemia: Secondary | ICD-10-CM

## 2017-05-02 DIAGNOSIS — M79671 Pain in right foot: Secondary | ICD-10-CM

## 2017-05-02 DIAGNOSIS — M545 Low back pain, unspecified: Secondary | ICD-10-CM

## 2017-05-02 DIAGNOSIS — M79672 Pain in left foot: Secondary | ICD-10-CM

## 2017-05-02 DIAGNOSIS — E7849 Other hyperlipidemia: Secondary | ICD-10-CM

## 2017-05-02 DIAGNOSIS — I10 Essential (primary) hypertension: Secondary | ICD-10-CM | POA: Diagnosis not present

## 2017-05-02 DIAGNOSIS — G8929 Other chronic pain: Secondary | ICD-10-CM | POA: Diagnosis not present

## 2017-05-02 MED ORDER — DICLOFENAC SODIUM 75 MG PO TBEC: 75.0000 mg | DELAYED_RELEASE_TABLET | Freq: Two times a day (BID) | ORAL | 1 refills | Status: DC

## 2017-05-02 MED ORDER — OMEPRAZOLE 40 MG PO CPDR: 40.0000 mg | DELAYED_RELEASE_CAPSULE | Freq: Every day | ORAL | 3 refills | Status: DC

## 2017-05-02 MED ORDER — HYDROCODONE-ACETAMINOPHEN 5-325 MG PO TABS: 1.0000 | ORAL_TABLET | Freq: Three times a day (TID) | ORAL | 0 refills | Status: DC | PRN

## 2017-05-02 MED ORDER — DULOXETINE HCL 60 MG PO CPEP: 60.0000 mg | ORAL_CAPSULE | Freq: Every day | ORAL | 2 refills | Status: DC

## 2017-05-02 NOTE — Progress Notes (Signed)
   Subjective:    Patient ID: Tara Dickson, female    DOB: [birth year], 56 y.o.   MRN: [id number]  HPI This patient was seen today for chronic pain  The medication list was reviewed and updated.   -Compliance with medication: yes  - Number patient states they take daily: 2  -when was the last dose patient took? today  The patient was advised the importance of maintaining medication and not using illegal substances with these.  Refills needed: yes  The patient was educated that we can provide 3 monthly scripts for their medication, it is their responsibility to follow the instructions.  Side effects or complications from medications: none  Patient is aware that pain medications are meant to minimize the severity of the pain to allow their pain levels to improve to allow for better function. They are aware of that pain medications cannot totally remove their pain.  Due for UDT ( at least once per year) : 10/2016  Patient with c/o burning in feet and has accidentally taken 2 Cymbalta a day for last 2 weeks and wonders what she should do This patient has been prescribed Xanax in the past by her psychiatrist but we will not be prescribing this to her.  I have advised the patient taking pain medication and Xanax together increases risk of accidental overdose The patient is taking her cholesterol medicine tries watch her diet She is taking her heart medicines she denies any heart disease symptoms no chest tightness pressure pain She is having bilateral foot pain she describes aching in her feet she feels like she has arthritis in her feet she denies numbness or tingling Review of Systems  Constitutional: Negative for activity change and appetite change.  Gastrointestinal: Negative for abdominal pain and vomiting.  Neurological: Negative for weakness.  Psychiatric/Behavioral: Negative for confusion.   she was accidentally taking too Cymbalta's per day but she was counseled to go back  to 1 She does take her reflux medicine and if she misses it more than a couple days she has significant troubles with heartburn      Objective:   Physical Exam  Constitutional: She appears well-nourished. No distress.  HENT:  Head: Normocephalic.  Cardiovascular: Normal rate, regular rhythm and normal heart sounds.   No murmur heard. Pulmonary/Chest: Effort normal and breath sounds normal.  Musculoskeletal: She exhibits no edema.  Lymphadenopathy:    She has no cervical adenopathy.  Neurological: She is alert.  Psychiatric: Her behavior is normal.  Vitals reviewed.  25 minutes was spent with the patient. Greater than half the time was spent in discussion and answering questions and counseling regarding the issues that the patient came in for today.        Assessment & Plan:  HTN good control continue current measures Patient no longer smoking Hyperlipidemia previous labs review  Labs ordered Continue medication Chronic pain-3 prescriptions on pain medicine given she may take a maximum of 3 per day but on other days she will have to average 2 per day, #75 for 30 days at a time Bilateral foot pain anti-inflammatory twice a day for the next few weeks then stop after that Follow-up in 3 months

## 2017-05-02 NOTE — Patient Instructions (Signed)
For your foot pain when you walk make sure you have a good walking shoe. You may use anti-inflammatory twice a day for the next 2-3 weeks then try stopping the anti-inflammatory  Follow-up in 3 months please do your lab work

## 2017-05-18 ENCOUNTER — Telehealth: Payer: Self-pay | Admitting: Family Medicine

## 2017-05-18 NOTE — Telephone Encounter (Signed)
#  1 this patient will need a follow-up office visit with either myself or Hoyle Sauer within the next 2-3 weeks #2 the patient needs to do her lab work before her office visit so we will have something to work with #3 we will check a urine specimen when she comes to repeat analysis for protein

## 2017-05-18 NOTE — Telephone Encounter (Signed)
Left message return call 05/18/2017

## 2017-05-18 NOTE — Telephone Encounter (Signed)
Spoke with patient and informed her that we received a phone call from the nurse with [facility] informing us of protein in her urine. Informed her per [place]  Recommend office visit with Himself or Hoyle Sauer in 2-3 weeks. Advised her to do her blood work prior to appointment. Patient verbalized understanding.

## 2017-05-18 NOTE — Telephone Encounter (Signed)
[employer]-Nurse Rhonda did a home visit with patient .She had plus 2 protein in urine ,blood pressure was good. The nurse wanted patient to do followup visit with primary care. Nurse Suanne Marker if you have any question-[phone]

## 2017-05-19 ENCOUNTER — Telehealth: Payer: Self-pay | Admitting: Family Medicine

## 2017-05-19 MED ORDER — NABUMETONE 500 MG PO TABS
500.0000 mg | ORAL_TABLET | Freq: Two times a day (BID) | ORAL | 0 refills | Status: DC
Start: 2017-05-19 — End: 2017-06-07

## 2017-05-19 NOTE — Telephone Encounter (Signed)
Patient was given this medication for bilateral foot pain

## 2017-05-19 NOTE — Telephone Encounter (Signed)
Prescription sent electronically to pharmacy. Patient notified. 

## 2017-05-19 NOTE — Telephone Encounter (Signed)
Pt was seen last week and was prescribed diclofenac. Pt states that they make her feel like something is crawling on her. Pt has stopped taking them night before last. Please advise.

## 2017-05-19 NOTE — Telephone Encounter (Signed)
Discontinue diclofenac. Recommend Relafen 500 mg 1 twice a day, #30

## 2017-05-26 DIAGNOSIS — E784 Other hyperlipidemia: Secondary | ICD-10-CM | POA: Diagnosis not present

## 2017-05-26 DIAGNOSIS — I10 Essential (primary) hypertension: Secondary | ICD-10-CM | POA: Diagnosis not present

## 2017-05-27 LAB — HEPATIC FUNCTION PANEL
ALBUMIN: 4.4 g/dL (ref 3.5–5.5)
ALT: 33 IU/L — ABNORMAL HIGH (ref 0–32)
AST: 34 IU/L (ref 0–40)
Alkaline Phosphatase: 98 IU/L (ref 39–117)
BILIRUBIN TOTAL: 0.5 mg/dL (ref 0.0–1.2)
Bilirubin, Direct: 0.12 mg/dL (ref 0.00–0.40)
TOTAL PROTEIN: 7.2 g/dL (ref 6.0–8.5)

## 2017-05-27 LAB — BASIC METABOLIC PANEL
BUN/Creatinine Ratio: 8 — ABNORMAL LOW (ref 9–23)
BUN: 7 mg/dL (ref 6–24)
CALCIUM: 9.6 mg/dL (ref 8.7–10.2)
CO2: 23 mmol/L (ref 18–29)
CREATININE: 0.9 mg/dL (ref 0.57–1.00)
Chloride: 107 mmol/L — ABNORMAL HIGH (ref 96–106)
GFR, EST AFRICAN AMERICAN: 83 mL/min/{1.73_m2} (ref >59)
GFR, EST NON AFRICAN AMERICAN: 72 mL/min/{1.73_m2} (ref >59)
Glucose: 83 mg/dL (ref 65–99)
Potassium: 4.4 mmol/L (ref 3.5–5.2)
Sodium: 145 mmol/L — ABNORMAL HIGH (ref 134–144)

## 2017-05-27 LAB — LIPID PANEL
CHOLESTEROL TOTAL: 147 mg/dL (ref 100–199)
Chol/HDL Ratio: 3.1 ratio (ref 0.0–4.4)
HDL: 48 mg/dL (ref >39)
LDL CALC: 78 mg/dL (ref 0–99)
Triglycerides: 103 mg/dL (ref 0–149)
VLDL Cholesterol Cal: 21 mg/dL (ref 5–40)

## 2017-06-07 ENCOUNTER — Encounter: Payer: Self-pay | Admitting: Family Medicine

## 2017-06-07 ENCOUNTER — Ambulatory Visit (INDEPENDENT_AMBULATORY_CARE_PROVIDER_SITE_OTHER): Payer: Medicare Other | Admitting: Family Medicine

## 2017-06-07 VITALS — BP 142/86 | Ht 68.5 in | Wt 180.2 lb

## 2017-06-07 DIAGNOSIS — R809 Proteinuria, unspecified: Secondary | ICD-10-CM

## 2017-06-07 NOTE — Progress Notes (Signed)
   Subjective:    Patient ID: Tara Dickson, female    DOB: Mar 23, 1961, 56 y.o.   MRN: [id number]  HPI  Patient arrives to discuss possible protein in urine. Patient had a home visit by a nurse they told her that she had protein in her urine she denies any hematuria. Denies any complication issues going on currently does have history of blood pressure as well as cholesterol chronic pain is not taking any NSAIDs currently Review of Systems  Constitutional: Negative for activity change, fatigue and fever.  Respiratory: Negative for cough and shortness of breath.   Cardiovascular: Negative for chest pain and leg swelling.  Neurological: Negative for headaches.       Objective:   Physical Exam  Constitutional: She appears well-nourished. No distress.  HENT:  Head: Normocephalic.  Cardiovascular: Normal rate, regular rhythm and normal heart sounds.   No murmur heard. Pulmonary/Chest: Effort normal and breath sounds normal.  Musculoskeletal: She exhibits no edema.  Lymphadenopathy:    She has no cervical adenopathy.  Neurological: She is alert.  Psychiatric: Her behavior is normal.  Vitals reviewed.         Assessment & Plan:  Questionable proteinuria-urine dipstick negative here we will send off urine ACR  Patient to keep her chronic pain visit later in August. To follow-up if problems.

## 2017-06-08 LAB — MICROALBUMIN / CREATININE URINE RATIO
Creatinine, Urine: 69.5 mg/dL
Microalb/Creat Ratio: <4.3 mg/g creat (ref 0.0–30.0)
Microalbumin, Urine: <3 ug/mL

## 2017-07-22 ENCOUNTER — Other Ambulatory Visit: Payer: Self-pay | Admitting: Family Medicine

## 2017-08-02 ENCOUNTER — Encounter: Payer: Self-pay | Admitting: Family Medicine

## 2017-08-02 ENCOUNTER — Ambulatory Visit (INDEPENDENT_AMBULATORY_CARE_PROVIDER_SITE_OTHER): Payer: Medicare Other | Admitting: Family Medicine

## 2017-08-02 VITALS — BP 120/74 | Ht 68.5 in | Wt 174.0 lb

## 2017-08-02 DIAGNOSIS — R42 Dizziness and giddiness: Secondary | ICD-10-CM

## 2017-08-02 DIAGNOSIS — K2100 Gastro-esophageal reflux disease with esophagitis, without bleeding: Secondary | ICD-10-CM

## 2017-08-02 DIAGNOSIS — I10 Essential (primary) hypertension: Secondary | ICD-10-CM

## 2017-08-02 DIAGNOSIS — Z79891 Long term (current) use of opiate analgesic: Secondary | ICD-10-CM | POA: Diagnosis not present

## 2017-08-02 MED ORDER — HYDROCODONE-ACETAMINOPHEN 5-325 MG PO TABS: 1.0000 | ORAL_TABLET | Freq: Three times a day (TID) | ORAL | 0 refills | Status: DC | PRN

## 2017-08-02 MED ORDER — LISINOPRIL 5 MG PO TABS: 5.0000 mg | ORAL_TABLET | Freq: Every day | ORAL | 3 refills | Status: DC

## 2017-08-02 NOTE — Progress Notes (Signed)
Subjective:    Patient ID: Tara Dickson, female    DOB: 1961/10/18, 56 y.o.   MRN: [id number]  HPI  This patient was seen today for chronic pain  The medication list was reviewed and updated.   -Compliance with medication: Yes  - Number patient states they take daily: Two   -when was the last dose patient took? Two nights ago.  The patient was advised the importance of maintaining medication and not using illegal substances with these.  Refills needed: Yes  The patient was educated that we can provide 3 monthly scripts for their medication, it is their responsibility to follow the instructions.  Side effects or complications from medications: None  Patient is aware that pain medications are meant to minimize the severity of the pain to allow their pain levels to improve to allow for better function. They are aware of that pain medications cannot totally remove their pain.  Due for UDT ( at least once per year) :Due today The patient has intermittent dizziness she states she'll find herself be standing for. A time and she feels woozy or dizzy the room doesn't really spend she feels off balance usually she has to sit down or otherwise she feels like she's got a pass out heart does not race fast she does not have any shortness of breath with it. No headaches no chest pressure tightness pain or sweats.  Patient takes her blood pressure medicine as directed using a low-dose of the Coreg in addition to this she takes her lisinopril on a regular basis  She has chronic pain pain medicine helps she uses anywhere between 2 and 3 daily she states occasionally she runs out he does help keep her pain under decent control  She does take her cholesterol medicine watch his diet previous labs reviewed with her Takes her reflux medicine keeps things under good control She sees her psychiatrist on a regular basis states depression under decent control     Review of Systems    Constitutional: Negative for activity change and appetite change.  HENT: Negative for congestion.   Respiratory: Negative for cough.   Cardiovascular: Negative for chest pain.  Gastrointestinal: Negative for abdominal pain and vomiting.  Musculoskeletal: Positive for arthralgias and back pain.  Neurological: Negative for weakness.  Psychiatric/Behavioral: Negative for confusion.       Objective:   Physical Exam  Constitutional: She appears well-nourished. No distress.  Cardiovascular: Normal rate, regular rhythm and normal heart sounds.   No murmur heard. Pulmonary/Chest: Effort normal and breath sounds normal. No respiratory distress.  Musculoskeletal: She exhibits no edema.  Lymphadenopathy:    She has no cervical adenopathy.  Neurological: She is alert. She exhibits normal muscle tone.  Psychiatric: Her behavior is normal.  Vitals reviewed.    25 minutes was spent with the patient. Greater than half the time was spent in discussion and answering questions and counseling regarding the issues that the patient came in for today.      Assessment & Plan:  The patient was seen today as part of a comprehensive visit regarding pain control. Patient's compliance with the medication as well as discussion regarding effectiveness was completed. Prescriptions were written. Patient was advised to follow-up in 3 months. The patient was assessed for any signs of severe side effects. The patient was advised to take the medicine as directed and to report to [place] if any side effect issues.  HTN- Patient was seen today as part of a visit  regarding hypertension. The importance of healthy diet and regular physical activity was discussed. The importance of compliance with medications discussed. Ideal goal is to keep blood pressure low elevated levels certainly below 370/48 when possible. The patient was counseled that keeping blood pressure under control lessen his risk of heart attack, stroke, kidney  failure, and early death. The importance of regular follow-ups was discussed with the patient. Low-salt diet such as DASH recommended. Regular physical activity was recommended as well. Patient was advised to keep regular follow-ups.  Patient does have some mild orthostasis on physical exam we will reduce lisinopril from 10 mg to 5 mg Reflux good control with medication continue this  Dizziness I believe this is not a stroke not a tumor but more likely related to mild orthostasis  Follow-up 3 months

## 2017-08-06 LAB — TOXASSURE SELECT 13 (MW), URINE

## 2017-10-31 ENCOUNTER — Other Ambulatory Visit: Payer: Self-pay | Admitting: Family Medicine

## 2017-11-03 ENCOUNTER — Encounter: Payer: Self-pay | Admitting: Family Medicine

## 2017-11-03 ENCOUNTER — Ambulatory Visit (INDEPENDENT_AMBULATORY_CARE_PROVIDER_SITE_OTHER): Payer: Medicare Other | Admitting: Family Medicine

## 2017-11-03 VITALS — BP 120/88 | Ht 68.5 in | Wt 177.0 lb

## 2017-11-03 DIAGNOSIS — R7303 Prediabetes: Secondary | ICD-10-CM | POA: Diagnosis not present

## 2017-11-03 DIAGNOSIS — Z23 Encounter for immunization: Secondary | ICD-10-CM | POA: Diagnosis not present

## 2017-11-03 DIAGNOSIS — Z79891 Long term (current) use of opiate analgesic: Secondary | ICD-10-CM

## 2017-11-03 DIAGNOSIS — I10 Essential (primary) hypertension: Secondary | ICD-10-CM | POA: Diagnosis not present

## 2017-11-03 MED ORDER — HYDROCODONE-ACETAMINOPHEN 5-325 MG PO TABS: 1.0000 | ORAL_TABLET | Freq: Three times a day (TID) | ORAL | 0 refills | Status: DC | PRN

## 2017-11-03 NOTE — Progress Notes (Signed)
   Subjective:    Patient ID: Tara Dickson, female    DOB: December 30, 1960, 56 y.o.   MRN: [id number]  HPI  This patient was seen today for chronic pain  The medication list was reviewed and updated.   -Compliance with medication: Yes  - Number patient states they take daily: 2-3 daily  -when was the last dose patient took? Monday afternoon.States she is trying to see if she can handle less and she is in pain.  The patient was advised the importance of maintaining medication and not using illegal substances with these.  Refills needed: Yes  The patient was educated that we can provide 3 monthly scripts for their medication, it is their responsibility to follow the instructions.  Side effects or complications from medications: None  Patient is aware that pain medications are meant to minimize the severity of the pain to allow their pain levels to improve to allow for better function. They are aware of that pain medications cannot totally remove their pain.  Due for UDT ( at least once per year) : August 2019      Review of Systems  Constitutional: Negative for activity change and appetite change.  HENT: Negative for congestion.   Respiratory: Negative for cough.   Cardiovascular: Negative for chest pain.  Gastrointestinal: Negative for abdominal pain and vomiting.  Skin: Negative for color change.  Neurological: Negative for weakness.  Psychiatric/Behavioral: Negative for confusion.       Objective:   Physical Exam  Constitutional: She appears well-nourished. No distress.  HENT:  Head: Normocephalic.  Right Ear: External ear normal.  Left Ear: External ear normal.  Eyes: Right eye exhibits no discharge. Left eye exhibits no discharge.  Neck: No tracheal deviation present.  Cardiovascular: Normal rate, regular rhythm and normal heart sounds.  No murmur heard. Pulmonary/Chest: Effort normal and breath sounds normal. No respiratory distress. She has no wheezes. She  has no rales.  Musculoskeletal: She exhibits no edema.  Lymphadenopathy:    She has no cervical adenopathy.  Neurological: She is alert.  Psychiatric: Her behavior is normal.  Vitals reviewed.         Assessment & Plan:  Blood pressure under good control continue current measures.  The patient was seen today as part of a comprehensive visit regarding pain control. Patient's compliance with the medication as well as discussion regarding effectiveness was completed. Prescriptions were written. Patient was advised to follow-up in 3 months. The patient was assessed for any signs of severe side effects. The patient was advised to take the medicine as directed and to report to [place] if any side effect issues. Patient doing a good job with her pain medicine drug registry was checked she takes anywhere between 2 or 3/day she denies abusing it.  No lab work today comprehensive lab work in the spring

## 2017-11-17 ENCOUNTER — Telehealth: Payer: Self-pay | Admitting: Family Medicine

## 2017-11-17 NOTE — Telephone Encounter (Signed)
Patient requesting something over the counter to take for her nerves fiancee past away last night .She doesn't want a prescription due to the medication she is on.

## 2017-11-17 NOTE — Telephone Encounter (Signed)
It is safe to use BuSpar 10 mg 1 3 times daily for nerves this is a prescription medicine but it is not a benzodiazepine so therefore it is safe with her pain medicine #90 with 1 refill

## 2017-11-17 NOTE — Telephone Encounter (Signed)
Left message return call 11/17/17

## 2017-11-21 MED ORDER — BUSPIRONE HCL 10 MG PO TABS: ORAL_TABLET | ORAL | 1 refills | Status: DC

## 2017-11-21 NOTE — Telephone Encounter (Signed)
Pt is aware and appreciative, med sent to [facility] in [place].

## 2017-11-21 NOTE — Addendum Note (Signed)
Addended by: Karle Barr on: 11/21/2017 03:03 PM   Modules accepted: Orders

## 2017-12-19 ENCOUNTER — Other Ambulatory Visit: Payer: Self-pay | Admitting: Family Medicine

## 2018-01-26 ENCOUNTER — Ambulatory Visit: Payer: Medicare Other | Admitting: Family Medicine

## 2018-01-31 ENCOUNTER — Other Ambulatory Visit: Payer: Self-pay | Admitting: Family Medicine

## 2018-02-03 NOTE — Telephone Encounter (Signed)
Cannot refill cephalexin.  Diclofenac is not on her medicine list please confirm with patient if she is taking this or not.  She has a history of gastritis

## 2018-02-07 ENCOUNTER — Encounter: Payer: Self-pay | Admitting: Family Medicine

## 2018-02-07 ENCOUNTER — Ambulatory Visit (INDEPENDENT_AMBULATORY_CARE_PROVIDER_SITE_OTHER): Payer: Medicare Other | Admitting: Family Medicine

## 2018-02-07 VITALS — BP 136/88 | Ht 68.5 in | Wt 170.0 lb

## 2018-02-07 DIAGNOSIS — I10 Essential (primary) hypertension: Secondary | ICD-10-CM | POA: Diagnosis not present

## 2018-02-07 DIAGNOSIS — E7849 Other hyperlipidemia: Secondary | ICD-10-CM

## 2018-02-07 DIAGNOSIS — Z79891 Long term (current) use of opiate analgesic: Secondary | ICD-10-CM

## 2018-02-07 MED ORDER — ATORVASTATIN CALCIUM 40 MG PO TABS: 40.0000 mg | ORAL_TABLET | Freq: Every day | ORAL | 3 refills | Status: DC

## 2018-02-07 MED ORDER — LISINOPRIL 5 MG PO TABS: 5.0000 mg | ORAL_TABLET | Freq: Every day | ORAL | 3 refills | Status: DC

## 2018-02-07 MED ORDER — HYDROCODONE-ACETAMINOPHEN 5-325 MG PO TABS: 1.0000 | ORAL_TABLET | Freq: Three times a day (TID) | ORAL | 0 refills | Status: DC | PRN

## 2018-02-07 MED ORDER — DULOXETINE HCL 60 MG PO CPEP: 60.0000 mg | ORAL_CAPSULE | Freq: Every day | ORAL | 3 refills | Status: DC

## 2018-02-07 MED ORDER — OMEPRAZOLE 40 MG PO CPDR: 40.0000 mg | DELAYED_RELEASE_CAPSULE | Freq: Every day | ORAL | 3 refills | Status: DC

## 2018-02-07 NOTE — Progress Notes (Signed)
   Subjective:    Patient ID: Tara Dickson, female    DOB: [birth year], 57 y.o.   MRN: [id number]  HPI This patient was seen today for chronic pain  The medication list was reviewed and updated.   -Compliance with medication: yes  - Number patient states they take daily: 2 sometimes 3  -when was the last dose patient took? Feb 04, 2018  The patient was advised the importance of maintaining medication and not using illegal substances with these.  Here for refills and follow up  The patient was educated that we can provide 3 monthly scripts for their medication, it is their responsibility to follow the instructions.  Side effects or complications from medications: none  Patient is aware that pain medications are meant to minimize the severity of the pain to allow their pain levels to improve to allow for better function. They are aware of that pain medications cannot totally remove their pain.  Due for UDT ( at least once per year) : 08/02/2017       Review of Systems  Constitutional: Negative for activity change and appetite change.  HENT: Negative for congestion.   Respiratory: Negative for cough.   Cardiovascular: Negative for chest pain.  Gastrointestinal: Negative for abdominal pain and vomiting.  Skin: Negative for color change.  Neurological: Negative for weakness.  Psychiatric/Behavioral: Negative for confusion.       Objective:   Physical Exam  Constitutional: She appears well-developed and well-nourished. No distress.  HENT:  Head: Normocephalic and atraumatic.  Eyes: Right eye exhibits no discharge. Left eye exhibits no discharge.  Neck: No tracheal deviation present.  Cardiovascular: Normal rate, regular rhythm and normal heart sounds.  No murmur heard. Pulmonary/Chest: Effort normal and breath sounds normal. No respiratory distress. She has no wheezes. She has no rales.  Musculoskeletal: She exhibits no edema.  Lymphadenopathy:    She has no  cervical adenopathy.  Neurological: She is alert. She exhibits normal muscle tone.  Skin: Skin is warm and dry. No erythema.  Psychiatric: Her behavior is normal.  Vitals reviewed.         Assessment & Plan:  The patient was seen today as part of a comprehensive visit regarding pain control. Patient's compliance with the medication as well as discussion regarding effectiveness was completed. Prescriptions were written. Patient was advised to follow-up in 3 months. The patient was assessed for any signs of severe side effects. The patient was advised to take the medicine as directed and to report to [place] if any side effect issues.  Patient with mild depression related to the loss of her partner Not suicidal-patient hanging in there as best she can Blood pressure good control watch diet Cholesterol continue medication check labs Follow-up 3 months

## 2018-03-13 ENCOUNTER — Other Ambulatory Visit: Payer: Self-pay | Admitting: Family Medicine

## 2018-03-13 ENCOUNTER — Other Ambulatory Visit: Payer: Self-pay | Admitting: *Deleted

## 2018-03-13 MED ORDER — LISINOPRIL 5 MG PO TABS: 5.0000 mg | ORAL_TABLET | Freq: Every day | ORAL | 0 refills | Status: DC

## 2018-03-13 MED ORDER — LEVETIRACETAM 500 MG PO TABS: ORAL_TABLET | ORAL | 0 refills | Status: DC

## 2018-03-13 MED ORDER — CARVEDILOL 3.125 MG PO TABS: 3.1250 mg | ORAL_TABLET | Freq: Two times a day (BID) | ORAL | 0 refills | Status: DC

## 2018-04-05 ENCOUNTER — Other Ambulatory Visit: Payer: Self-pay | Admitting: Family Medicine

## 2018-04-05 NOTE — Telephone Encounter (Signed)
Please send notification to the pharmacy that patient is now on lisinopril 5 mg daily.  Also patient no longer on diclofenac.  And we will not prescribe an antibiotic via this method patient will have to call regarding that

## 2018-05-08 ENCOUNTER — Ambulatory Visit: Payer: Medicare Other | Admitting: Family Medicine

## 2018-05-09 ENCOUNTER — Ambulatory Visit: Payer: Medicare Other | Admitting: Family Medicine

## 2018-05-23 ENCOUNTER — Other Ambulatory Visit: Payer: Self-pay | Admitting: Family Medicine

## 2018-06-05 ENCOUNTER — Ambulatory Visit (INDEPENDENT_AMBULATORY_CARE_PROVIDER_SITE_OTHER): Payer: Medicare Other | Admitting: Family Medicine

## 2018-06-05 ENCOUNTER — Encounter: Payer: Self-pay | Admitting: Family Medicine

## 2018-06-05 VITALS — BP 148/96 | Ht 68.5 in | Wt 166.0 lb

## 2018-06-05 DIAGNOSIS — R0789 Other chest pain: Secondary | ICD-10-CM

## 2018-06-05 DIAGNOSIS — I10 Essential (primary) hypertension: Secondary | ICD-10-CM | POA: Diagnosis not present

## 2018-06-05 DIAGNOSIS — D649 Anemia, unspecified: Secondary | ICD-10-CM | POA: Diagnosis not present

## 2018-06-05 DIAGNOSIS — E7849 Other hyperlipidemia: Secondary | ICD-10-CM | POA: Diagnosis not present

## 2018-06-05 DIAGNOSIS — R5383 Other fatigue: Secondary | ICD-10-CM

## 2018-06-05 DIAGNOSIS — G40909 Epilepsy, unspecified, not intractable, without status epilepticus: Secondary | ICD-10-CM | POA: Diagnosis not present

## 2018-06-05 MED ORDER — HYDROCODONE-ACETAMINOPHEN 5-325 MG PO TABS: 1.0000 | ORAL_TABLET | Freq: Three times a day (TID) | ORAL | 0 refills | Status: DC | PRN

## 2018-06-05 MED ORDER — LISINOPRIL 5 MG PO TABS: 5.0000 mg | ORAL_TABLET | Freq: Every day | ORAL | 1 refills | Status: DC

## 2018-06-05 MED ORDER — LEVETIRACETAM 500 MG PO TABS: ORAL_TABLET | ORAL | 1 refills | Status: DC

## 2018-06-05 MED ORDER — CARVEDILOL 3.125 MG PO TABS: 3.1250 mg | ORAL_TABLET | Freq: Two times a day (BID) | ORAL | 1 refills | Status: DC

## 2018-06-05 NOTE — Progress Notes (Signed)
Subjective:    Patient ID: Tara Dickson, female    DOB: 1961/10/23, 57 y.o.   MRN: [id number]  HPI This patient was seen today for chronic pain. Takes for back pain  The medication list was reviewed and updated.   -Compliance with medication: has been out of pain meds for 2 months  - Number patient states they take daily: was taking 3 a day but has been out for about 2 months  -when was the last dose patient took? 2 months ago  The patient was advised the importance of maintaining medication and not using illegal substances with these.  Here for refills and follow up  The patient was educated that we can provide 3 monthly scripts for their medication, it is their responsibility to follow the instructions.  Side effects or complications from medications: none  Patient is aware that pain medications are meant to minimize the severity of the pain to allow their pain levels to improve to allow for better function. They are aware of that pain medications cannot totally remove their pain.  Due for UDT ( at least once per year) : last one august 2018  Feels like something heavy on chest when she tries to walk. Started about one and a half weeks ago. Some sob and nausea. No energy.  She does have a history of heart disease she states that she felt this pressure well when she was walking and when she tried to do some walking for exercise she states she got no more than 3 blocks that occurred she stopped exercising several weeks ago because of this she has not seen cardiology recently  She does have underlying seizure disorder she takes her medicine denies missing it states no seizures recently  She does try to eat healthy and stay mildly active although recently unable to exercise  She has a history of melanoma she was concerned about some bumps on her back       Review of Systems  Constitutional: Negative for activity change, appetite change and fatigue.  HENT: Negative for  congestion.   Respiratory: Negative for cough and shortness of breath.   Cardiovascular: Positive for chest pain. Negative for leg swelling.  Gastrointestinal: Negative for abdominal pain.  Endocrine: Negative for polydipsia and polyphagia.  Skin: Negative for color change.  Neurological: Negative for weakness.  Psychiatric/Behavioral: Negative for confusion.       Objective:   Physical Exam  Constitutional: She appears well-nourished. No distress.  HENT:  Head: Normocephalic and atraumatic.  Eyes: Right eye exhibits no discharge. Left eye exhibits no discharge.  Neck: No tracheal deviation present.  Cardiovascular: Normal rate, regular rhythm and normal heart sounds.  No murmur heard. Pulmonary/Chest: Effort normal and breath sounds normal. No respiratory distress.  Abdominal: Soft. She exhibits no distension.  Musculoskeletal: She exhibits no edema.  Lymphadenopathy:    She has no cervical adenopathy.  Neurological: She is alert. She exhibits normal muscle tone.  Psychiatric: Her behavior is normal.  Vitals reviewed.  EKG shows no acute changes it was compared against most previous EKG with in the electronic medical record  No melanoma noted on her back no abnormal growths     Assessment & Plan:  HTN good control watch diet continue current medication  Seizure disorder continue antiseizure medicine no flareups recently  Hyperlipidemia lab work ordered important to keep things under good control to lessen the risk of heart disease  History of anemia we will recheck CBC patient with  fatigue also check thyroid  The patient was seen in followup for chronic pain. A review over at their current pain status was discussed. Drug registry was checked. Prescriptions were given. Discussion was held regarding the importance of compliance with medication as well as pain medication contract.  Time for questions regarding pain management plan occurred. Importance of regular  followup visits was discussed. Patient was informed that medication may cause drowsiness and should not be combined  with other medications/alcohol or street drugs. Patient was cautioned that medication could cause drowsiness. If the patient feels medication is causing altered alertness then do not drive or operate dangerous equipment.  DOE chest tightness recommend cardiology consultation this happened a couple weeks ago I do not feel the patient had a heart attack but she does have a history of coronary artery disease we need to make sure she is not having any progression of coronary artery blockage patient does not smoke cigarettes but does occasionally use marijuana she was cautioned against this  25 minutes was spent with the patient.  This statement verifies that 25 minutes was indeed spent with the patient. Greater than half the time was spent in discussion, counseling and answering questions  regarding the issues that the patient came in for today as reflected in the diagnosis (s) please refer to documentation for further details. Due to the length of time spent involvement assessing her chest discomfort also chronic pain plus also her chronic other issues greater than 25 minutes spent greater than half in discussion  Follow-up 3 months

## 2018-06-06 ENCOUNTER — Encounter: Payer: Self-pay | Admitting: Family Medicine

## 2018-06-14 ENCOUNTER — Encounter: Payer: Self-pay | Admitting: Cardiovascular Disease

## 2018-06-14 ENCOUNTER — Ambulatory Visit (INDEPENDENT_AMBULATORY_CARE_PROVIDER_SITE_OTHER): Payer: Medicare Other | Admitting: Cardiovascular Disease

## 2018-06-14 VITALS — BP 142/88 | HR 73 | Ht 68.5 in | Wt 166.8 lb

## 2018-06-14 DIAGNOSIS — I5181 Takotsubo syndrome: Secondary | ICD-10-CM | POA: Diagnosis not present

## 2018-06-14 DIAGNOSIS — R079 Chest pain, unspecified: Secondary | ICD-10-CM | POA: Diagnosis not present

## 2018-06-14 DIAGNOSIS — R0602 Shortness of breath: Secondary | ICD-10-CM | POA: Diagnosis not present

## 2018-06-14 DIAGNOSIS — I10 Essential (primary) hypertension: Secondary | ICD-10-CM

## 2018-06-14 DIAGNOSIS — R0609 Other forms of dyspnea: Secondary | ICD-10-CM

## 2018-06-14 NOTE — Progress Notes (Signed)
SUBJECTIVE: The patient presents for long overdue follow-up.  I last evaluated her in November 2016.  She has a history of stress-induced cardiomyopathy and hypertension.  Her most recent echocardiogram performed on 11/19/2015 demonstrated normal left ventricular systolic function, LVEF 55 to 20%, grade 1 diastolic dysfunction, and mild mitral and aortic regurgitation.  She then underwent a submaximal stress test on 01/30/2016.  She underwent a normal coronary angiogram on 10/18/2013.  She has a history of chronic pain.  She saw her PCP on 06/05/2018 and complained of exertional dyspnea and chest tightness.  I personally reviewed the ECG performed on 06/06/2018 which demonstrated sinus rhythm with no ischemic ST segment or T wave abnormalities, nor any arrhythmias.  For the past 3 to 4 weeks, she has been experiencing a "squeezing sensation "in the upper left chest with walking.  It occurs when she is walking on the sidewalk and when she is walking in parking lots.  It is accompanied by exertional dyspnea.  She denies palpitations and syncope.  She has occasional dizziness.  She denies leg swelling, orthopnea, and paroxysmal nocturnal dyspnea.  Social history: She occasionally smokes marijuana but denies tobacco use.  Her mother passed away within the past few years and her fianc also passed away on their anniversary.  She tries to exercise and eat right.  Family history: Her father developed coronary disease in his late 52s.     Review of Systems: As per "subjective", otherwise negative.  Allergies  Allergen Reactions  . Klonopin [Clonazepam]     Made her mean    Current Outpatient Medications  Medication Sig Dispense Refill  . aspirin 81 MG tablet Take 81 mg by mouth daily.    Marland Kitchen atorvastatin (LIPITOR) 40 MG tablet Take 1 tablet (40 mg total) by mouth daily. 90 tablet 3  . carvedilol (COREG) 3.125 MG tablet Take 1.5 mg by mouth 2 (two) times daily with a meal.    .  DULoxetine (CYMBALTA) 60 MG capsule Take 1 capsule (60 mg total) by mouth daily. 90 capsule 3  . HYDROcodone-acetaminophen (NORCO/VICODIN) 5-325 MG tablet Take 1 tablet by mouth 3 (three) times daily as needed. 90 tablet 0  . levETIRAcetam (KEPPRA) 500 MG tablet TAKE 1 TABLET BY MOUTH  TWICE A DAY 180 tablet 1  . lisinopril (PRINIVIL,ZESTRIL) 5 MG tablet Take 1 tablet (5 mg total) by mouth daily. 90 tablet 1  . omeprazole (PRILOSEC) 40 MG capsule Take 1 capsule (40 mg total) by mouth daily. 90 capsule 3  . OVER THE COUNTER MEDICATION Potassium 595 mg 2 times weekly    . VITAMIN D, CHOLECALCIFEROL, PO Take by mouth 2 (two) times a week.     No current facility-administered medications for this visit.     Past Medical History:  Diagnosis Date  . Anxiety   . Bipolar 1 disorder ([place])   . Cardiomyopathy, nonischemic ([place]) 2014   Cardiomyopathy nonischemic possible Takasubo syndrome  . Chronic back pain   . Depression   . Fibromyalgia   . GERD (gastroesophageal reflux disease)   . Hypercholesteremia   . Hypertension   . Impaired fasting glucose   . Melanoma ([place]) 12/2008   Right leg  . Myocardial infarction ([place]) 09/2013  . Osteoarthritis 2007   left knee  . Seizures ([place]) 09/2006   last seizure was 2 years ago, unknown etiology for seizures, possible taking her off "my xanax".    Past Surgical History:  Procedure Laterality Date  .  ABDOMINAL HYSTERECTOMY  2006  . COLONOSCOPY  08/2010   Dr. Gala Romney: Single hemorrhoidal tag, otherwise normal.  . COLONOSCOPY  2005?   C.diff per medical reports  . COLONOSCOPY WITH PROPOFOL N/A 02/27/2015   Procedure: COLONOSCOPY WITH PROPOFOL At cecum at 1144; total withdrawal time=23minutes;  Surgeon: Daneil Dolin, MD;  Location: [facility];  Service: Endoscopy;  Laterality: N/A;  . ESOPHAGOGASTRODUODENOSCOPY  2005?   erosive reflux esophagitis per medical history  . ESOPHAGOGASTRODUODENOSCOPY (EGD) WITH PROPOFOL N/A 02/27/2015   Procedure:  ESOPHAGOGASTRODUODENOSCOPY (EGD) WITH PROPOFOL;  Surgeon: Daneil Dolin, MD;  Location: [facility];  Service: Endoscopy;  Laterality: N/A;  . LEFT AND RIGHT HEART CATHETERIZATION WITH CORONARY ANGIOGRAM N/A 10/18/2013   Procedure: LEFT AND RIGHT HEART CATHETERIZATION WITH CORONARY ANGIOGRAM;  Surgeon: Sinclair Grooms, MD;  Location: [facility] CATH LAB;  Service: Cardiovascular;  Laterality: N/A;  . melanoma surgery    . POLYPECTOMY  02/27/2015   Procedure: POLYPECTOMY;  Surgeon: Daneil Dolin, MD;  Location: [facility];  Service: Endoscopy;;    Social History   Socioeconomic History  . Marital status: Single    Spouse name: Not on file  . Number of children: Not on file  . Years of education: Not on file  . Highest education level: Not on file  Occupational History  . Not on file  Social Needs  . Financial resource strain: Not on file  . Food insecurity:    Worry: Not on file    Inability: Not on file  . Transportation needs:    Medical: Not on file    Non-medical: Not on file  Tobacco Use  . Smoking status: Former Smoker    Packs/day: 2.00    Years: 15.00    Pack years: 30.00    Types: Cigarettes    Start date: 08/28/1973    Last attempt to quit: 10/18/1997    Years since quitting: 20.6  . Smokeless tobacco: Never Used  . Tobacco comment: Quit smoking x 15 years, 05-31-2016 per pt no.  Substance and Sexual Activity  . Alcohol use: No    Alcohol/week: 0.0 oz    Comment: 05-31-2016 per pt no  . Drug use: Yes    Types: Marijuana  . Sexual activity: Yes    Birth control/protection: None  Lifestyle  . Physical activity:    Days per week: Not on file    Minutes per session: Not on file  . Stress: Not on file  Relationships  . Social connections:    Talks on phone: Not on file    Gets together: Not on file    Attends religious service: Not on file    Active member of club or organization: Not on file    Attends meetings of clubs or organizations: Not on file    Relationship status:  Not on file  . Intimate partner violence:    Fear of current or ex partner: Not on file    Emotionally abused: Not on file    Physically abused: Not on file    Forced sexual activity: Not on file  Other Topics Concern  . Not on file  Social History Narrative  . Not on file     Vitals:   06/14/18 0938 06/14/18 0945  BP: (!) 138/98 (!) 142/88  Pulse: 73   SpO2: 99%   Weight: 166 lb 12.8 oz (75.7 kg)   Height: 5' 8.5" (1.74 m)     Wt Readings from Last 3 Encounters:  06/14/18 166 lb 12.8 oz (75.7 kg)  06/05/18 166 lb (75.3 kg)  02/07/18 170 lb (77.1 kg)     PHYSICAL EXAM General: NAD HEENT: Normal. Neck: No JVD, no thyromegaly. Lungs: Clear to auscultation bilaterally with normal respiratory effort. CV: Regular rate and rhythm, normal S1/S2, no S3/S4, no murmur. No pretibial or periankle edema.  No carotid bruit.   Abdomen: Soft, nontender, no distention.  Neurologic: Alert and oriented.  Psych: Normal affect. Skin: Normal. Musculoskeletal: No gross deformities.    ECG: Reviewed above under Subjective   Labs: Lab Results  Component Value Date/Time   K 4.4 05/26/2017 12:55 PM   BUN 7 05/26/2017 12:55 PM   CREATININE 0.90 05/26/2017 12:55 PM   CREATININE 0.77 09/18/2014 10:50 AM   ALT 33 (H) 05/26/2017 12:55 PM   TSH 0.703 11/01/2016 10:00 AM   HGB 11.4 11/01/2016 10:00 AM     Lipids: Lab Results  Component Value Date/Time   LDLCALC 78 05/26/2017 12:55 PM   CHOL 147 05/26/2017 12:55 PM   TRIG 103 05/26/2017 12:55 PM   HDL 48 05/26/2017 12:55 PM       ASSESSMENT AND PLAN: 1.  Chest tightness and exertional dyspnea with a history of stress-induced cardiomyopathy: Echocardiogram demonstrated normalization of left ventricular systolic function in 7001 as noted above.  She had widely patent coronary arteries by coronary angiography in 2014.  Her father developed coronary disease in his late 22s and she is roughly the same age. I will proceed with a nuclear  myocardial perfusion imaging study to evaluate for ischemic heart disease (Lexiscan Myoview).  2.  Hypertension: Blood pressure is elevated today.  This will need further monitoring.    Disposition: Follow up 2 months  A high level of decision making was required for increased medical complexities.   Kate Sable, M.D., F.A.C.C.

## 2018-06-14 NOTE — Patient Instructions (Signed)
Medication Instructions:  Your physician recommends that you continue on your current medications as directed. Please refer to the Current Medication list given to you today.  Labwork: none  Testing/Procedures: Your physician has requested that you have a lexiscan myoview. For further information please visit [email or web address]. Please follow instruction sheet, as given.  Follow-Up: Your physician recommends that you schedule a follow-up appointment in: 2 months  Any Other Special Instructions Will Be Listed Below (If Applicable).  If you need a refill on your cardiac medications before your next appointment, please call your pharmacy. 

## 2018-06-26 ENCOUNTER — Ambulatory Visit (HOSPITAL_COMMUNITY)
Admission: RE | Admit: 2018-06-26 | Discharge: 2018-06-26 | Disposition: A | Discharge status: Home / Self Care | Payer: Medicare Other | Source: Ambulatory Visit | Attending: Family Medicine | Admitting: Family Medicine

## 2018-06-26 ENCOUNTER — Encounter (HOSPITAL_COMMUNITY)
Admission: RE | Admit: 2018-06-26 | Discharge: 2018-06-26 | Disposition: A | Discharge status: Home / Self Care | Payer: Medicare Other | Source: Ambulatory Visit | Attending: Cardiovascular Disease | Admitting: Cardiovascular Disease

## 2018-06-26 DIAGNOSIS — R0609 Other forms of dyspnea: Secondary | ICD-10-CM | POA: Diagnosis not present

## 2018-06-26 DIAGNOSIS — R079 Chest pain, unspecified: Secondary | ICD-10-CM | POA: Diagnosis not present

## 2018-06-26 DIAGNOSIS — R0602 Shortness of breath: Secondary | ICD-10-CM

## 2018-06-26 LAB — NM MYOCAR MULTI W/SPECT W/WALL MOTION / EF
CHL CUP NUCLEAR SDS: 0
CHL CUP NUCLEAR SSS: 0
LHR: 0.27
LV sys vol: 49 mL
LVDIAVOL: 99 mL (ref 46–106)
Peak HR: 90 {beats}/min
Rest HR: 46 {beats}/min
SRS: 0
TID: 0.99

## 2018-06-26 MED ORDER — SODIUM CHLORIDE 0.9% FLUSH
INTRAVENOUS | Status: AC
  Administered 2018-06-26: 10 mL via INTRAVENOUS
  Filled 2018-06-26: qty 10

## 2018-06-26 MED ORDER — TECHNETIUM TC 99M TETROFOSMIN IV KIT
30.0000 | PACK | Freq: Once | INTRAVENOUS | Status: AC | PRN
  Administered 2018-06-26: 29 via INTRAVENOUS

## 2018-06-26 MED ORDER — TECHNETIUM TC 99M TETROFOSMIN IV KIT
10.0000 | PACK | Freq: Once | INTRAVENOUS | Status: AC | PRN
  Administered 2018-06-26: 10 via INTRAVENOUS

## 2018-06-26 MED ORDER — REGADENOSON 0.4 MG/5ML IV SOLN
INTRAVENOUS | Status: AC
  Administered 2018-06-26: 0.4 mg via INTRAVENOUS
  Filled 2018-06-26: qty 5

## 2018-06-29 ENCOUNTER — Other Ambulatory Visit: Payer: Self-pay | Admitting: *Deleted

## 2018-06-29 ENCOUNTER — Telehealth: Payer: Self-pay | Admitting: Family Medicine

## 2018-06-29 MED ORDER — PENICILLIN V POTASSIUM 500 MG PO TABS: 500.0000 mg | ORAL_TABLET | Freq: Three times a day (TID) | ORAL | 0 refills | Status: DC

## 2018-06-29 NOTE — Telephone Encounter (Signed)
Pt has a bad abscess inside her mouth Started a couple days ago - getting worse  Can we call in antibiotics or NTBS?  Pt states she does not have a dentist   Please advise & call pt   [place]

## 2018-06-29 NOTE — Telephone Encounter (Signed)
pcn 500 vk tid for ten d if they do not have pcn amox 500 tid te n d

## 2018-06-29 NOTE — Telephone Encounter (Signed)
Called [facility] and they do have pcn vk 500mg . Med sent to pharm and pt notified.

## 2018-06-29 NOTE — Telephone Encounter (Signed)
Abscess tooth for 2 days. Would like to get antibiotic. Has a friend trying to get her in with a dentist

## 2018-07-02 ENCOUNTER — Other Ambulatory Visit: Payer: Self-pay

## 2018-07-02 ENCOUNTER — Encounter (HOSPITAL_COMMUNITY): Payer: Self-pay

## 2018-07-02 ENCOUNTER — Emergency Department (HOSPITAL_COMMUNITY)
Admission: EM | Admit: 2018-07-02 | Discharge: 2018-07-02 | Disposition: A | Discharge status: Home / Self Care | Payer: Medicare Other | Attending: Emergency Medicine | Admitting: Emergency Medicine

## 2018-07-02 DIAGNOSIS — Z7982 Long term (current) use of aspirin: Secondary | ICD-10-CM | POA: Insufficient documentation

## 2018-07-02 DIAGNOSIS — R220 Localized swelling, mass and lump, head: Secondary | ICD-10-CM | POA: Diagnosis not present

## 2018-07-02 DIAGNOSIS — Z79899 Other long term (current) drug therapy: Secondary | ICD-10-CM | POA: Insufficient documentation

## 2018-07-02 DIAGNOSIS — Z87891 Personal history of nicotine dependence: Secondary | ICD-10-CM | POA: Diagnosis not present

## 2018-07-02 DIAGNOSIS — I252 Old myocardial infarction: Secondary | ICD-10-CM | POA: Insufficient documentation

## 2018-07-02 DIAGNOSIS — Z85820 Personal history of malignant melanoma of skin: Secondary | ICD-10-CM | POA: Diagnosis not present

## 2018-07-02 DIAGNOSIS — I10 Essential (primary) hypertension: Secondary | ICD-10-CM | POA: Diagnosis not present

## 2018-07-02 DIAGNOSIS — K047 Periapical abscess without sinus: Secondary | ICD-10-CM | POA: Diagnosis not present

## 2018-07-02 MED ORDER — CLINDAMYCIN HCL 150 MG PO CAPS: 300.0000 mg | ORAL_CAPSULE | Freq: Three times a day (TID) | ORAL | 0 refills | Status: AC

## 2018-07-02 MED ORDER — CLINDAMYCIN PHOSPHATE 600 MG/50ML IV SOLN
600.0000 mg | Freq: Once | INTRAVENOUS | Status: AC
  Administered 2018-07-02: 600 mg via INTRAVENOUS
  Filled 2018-07-02: qty 50

## 2018-07-02 MED ORDER — MELOXICAM 7.5 MG PO TABS: 15.0000 mg | ORAL_TABLET | Freq: Every day | ORAL | 0 refills | Status: DC

## 2018-07-02 NOTE — ED Provider Notes (Signed)
[facility] EMERGENCY DEPARTMENT Provider Note   CSN: [id number] Arrival date & time: 07/02/18  1618     History   Chief Complaint Chief Complaint  Patient presents with  . Facial Swelling    HPI LAURNA SHETLEY is a 57 y.o. female.  HPI  57 year old female with multiple medical problems, she has a nonischemic cardiomyopathy, she has a history of bipolar disorder, anxiety, she is not a diabetic.  She presents today with a complaint of left-sided jaw swelling, this is been going on for the last 24 hours.  It started with a toothache 2 days ago for which she was prescribed penicillin by her family doctor.  She has been taking the medication, multiple doses however today she woke up and had some swelling of the left lower jaw.  Worse with palpation, worse with opening the mouth, not associated with nausea vomiting fevers chills coughing or shortness of breath or any chest pain.  She has multiple degraded teeth, she has not seen a dentist in quite some time.  She states that she has tried to pull out multiple teeth in the past because they have been so rotten that they cause pain and swelling.  She has been unable to pull these teeth.  Past Medical History:  Diagnosis Date  . Anxiety   . Bipolar 1 disorder ([place])   . Cardiomyopathy, nonischemic ([place]) 2014   Cardiomyopathy nonischemic possible Takasubo syndrome  . Chronic back pain   . Depression   . Fibromyalgia   . GERD (gastroesophageal reflux disease)   . Hypercholesteremia   . Hypertension   . Impaired fasting glucose   . Melanoma ([place]) 12/2008   Right leg  . Myocardial infarction ([place]) 09/2013  . Osteoarthritis 2007   left knee  . Seizures ([place]) 09/2006   last seizure was 2 years ago, unknown etiology for seizures, possible taking her off "my xanax".    Patient Active Problem List   Diagnosis Date Noted  . Ingrown right big toenail 12/30/2016  . Major depression 05/03/2016  . History of melanoma 11/03/2015  .  Reflux esophagitis   . History of colonic polyps   . Heme positive stool 02/14/2015  . Anorexia nervosa 02/14/2015  . Loss of weight 02/14/2015  . Constipation 02/14/2015  . Exposure to hepatitis C 02/14/2015  . Prediabetes 02/14/2015  . Normocytic anemia 12/30/2014  . Chronic bilateral low back pain without sciatica 03/29/2014  . HTN (hypertension), benign 01/02/2014  . Hyperlipidemia 01/02/2014  . History of malignant melanoma of skin 01/02/2014  . Stress-induced cardiomyopathy 10/20/2013  . Obesity (BMI 30-39.9)   . Aspiration pneumonia ([place]) 10/19/2013  . Acute systolic HF (heart failure) ([place]) 10/18/2013    Class: Acute  . NSTEMI (non-ST elevated myocardial infarction) ([place]) 10/18/2013    Class: Acute  . Seizure disorder ([place]) 10/18/2013    Class: Acute  . Bipolar disorder ([place]) 10/18/2013    Class: Chronic    Past Surgical History:  Procedure Laterality Date  . ABDOMINAL HYSTERECTOMY  2006  . COLONOSCOPY  08/2010   Dr. Gala Romney: Single hemorrhoidal tag, otherwise normal.  . COLONOSCOPY  2005?   C.diff per medical reports  . COLONOSCOPY WITH PROPOFOL N/A 02/27/2015   Procedure: COLONOSCOPY WITH PROPOFOL At cecum at 1144; total withdrawal time=10minutes;  Surgeon: Daneil Dolin, MD;  Location: [facility];  Service: Endoscopy;  Laterality: N/A;  . ESOPHAGOGASTRODUODENOSCOPY  2005?   erosive reflux esophagitis per medical history  . ESOPHAGOGASTRODUODENOSCOPY (EGD) WITH PROPOFOL N/A  02/27/2015   Procedure: ESOPHAGOGASTRODUODENOSCOPY (EGD) WITH PROPOFOL;  Surgeon: Daneil Dolin, MD;  Location: [facility];  Service: Endoscopy;  Laterality: N/A;  . LEFT AND RIGHT HEART CATHETERIZATION WITH CORONARY ANGIOGRAM N/A 10/18/2013   Procedure: LEFT AND RIGHT HEART CATHETERIZATION WITH CORONARY ANGIOGRAM;  Surgeon: Sinclair Grooms, MD;  Location: [facility] CATH LAB;  Service: Cardiovascular;  Laterality: N/A;  . melanoma surgery    . POLYPECTOMY  02/27/2015   Procedure: POLYPECTOMY;  Surgeon: Daneil Dolin, MD;  Location: [facility];  Service: Endoscopy;;     OB History   None      Home Medications    Prior to Admission medications   Medication Sig Start Date End Date Taking? Authorizing Provider  aspirin 81 MG tablet Take 81 mg by mouth daily.   Yes [provider]  atorvastatin (LIPITOR) 40 MG tablet Take 1 tablet (40 mg total) by mouth daily. 02/07/18  Yes Kathyrn Drown, MD  carvedilol (COREG) 3.125 MG tablet Take 1.5 mg by mouth 2 (two) times daily with a meal.   Yes [provider]  DULoxetine (CYMBALTA) 60 MG capsule Take 1 capsule (60 mg total) by mouth daily. 02/07/18 02/07/19 Yes Luking, Elayne Snare, MD  HYDROcodone-acetaminophen (NORCO/VICODIN) 5-325 MG tablet Take 1 tablet by mouth 3 (three) times daily as needed. 06/05/18  Yes Kathyrn Drown, MD  levETIRAcetam (KEPPRA) 500 MG tablet TAKE 1 TABLET BY MOUTH  TWICE A DAY 06/05/18  Yes Luking, Scott A, MD  lisinopril (PRINIVIL,ZESTRIL) 5 MG tablet Take 1 tablet (5 mg total) by mouth daily. 06/05/18  Yes Kathyrn Drown, MD  omeprazole (PRILOSEC) 40 MG capsule Take 1 capsule (40 mg total) by mouth daily. 02/07/18  Yes Kathyrn Drown, MD  penicillin v potassium (VEETID) 500 MG tablet Take 1 tablet (500 mg total) by mouth 3 (three) times daily. 06/29/18  Yes Mikey Kirschner, MD  VITAMIN D, CHOLECALCIFEROL, PO Take by mouth 2 (two) times a week.   Yes [provider]    Family History Family History  Problem Relation Age of Onset  . Hypertension Mother   . Anxiety disorder Mother   . Hypertension Father   . Alcohol abuse Paternal Aunt   . Alcohol abuse Paternal Uncle   . Drug abuse Brother   . Colon cancer Neg Hx     Social History Social History   Tobacco Use  . Smoking status: Former Smoker    Packs/day: 2.00    Years: 15.00    Pack years: 30.00    Types: Cigarettes    Start date: 08/28/1973    Last attempt to quit: 10/18/1997    Years since quitting: 20.7  . Smokeless tobacco: Never Used  .  Tobacco comment: Quit smoking x 15 years, 05-31-2016 per pt no.  Substance Use Topics  . Alcohol use: No    Alcohol/week: 0.0 oz    Comment: 05-31-2016 per pt no  . Drug use: Yes    Types: Marijuana     Allergies   Klonopin [clonazepam]   Review of Systems Review of Systems  Constitutional: Negative for chills and fever.  HENT: Positive for dental problem and facial swelling.   Respiratory: Negative for cough, chest tightness and shortness of breath.   Cardiovascular: Negative for chest pain.  Gastrointestinal: Negative for nausea and vomiting.  Musculoskeletal: Negative for neck pain.  Skin: Negative for rash.     Physical Exam Updated Vital Signs BP (!) 158/97 (BP  Location: Right Arm)   Pulse 83   Temp 98.3 F (36.8 C) (Oral)   Resp 20   Ht 5\' 8"  (1.727 m)   Wt 75.3 kg (166 lb)   SpO2 100%   BMI 25.24 kg/m   Physical Exam  Constitutional: She appears well-developed and well-nourished. No distress.  HENT:  Head: Normocephalic and atraumatic.  Mouth/Throat: Oropharynx is clear and moist. No oropharyngeal exudate.  Dental Disease, left lower jaw with some gingival tenderness and swelling, there is asymmetry with left lower mandibular swelling but no palpable abscess or fluctuance.  No trismus or torticollis, no tenderness under the tongue, very supple neck  Eyes: Conjunctivae are normal. No scleral icterus.  Neck: Normal range of motion. Neck supple. No thyromegaly present.  Cardiovascular: Normal rate and regular rhythm.  Pulmonary/Chest: Effort normal and breath sounds normal.  Lymphadenopathy:    She has no cervical adenopathy.  Neurological: She is alert.  Skin: Skin is warm and dry. No rash noted. She is not diaphoretic.  Nursing note and vitals reviewed.    ED Treatments / Results  Labs (all labs ordered are listed, but only abnormal results are displayed) Labs Reviewed - No data to display  EKG None  Radiology No results  found.  Procedures Procedures (including critical care time)  Medications Ordered in ED Medications  clindamycin (CLEOCIN) IVPB 600 mg (has no administration in time range)     Initial Impression / Assessment and Plan / ED Course  I have reviewed the triage vital signs and the nursing notes.  Pertinent labs & imaging results that were available during my care of the patient were reviewed by me and considered in my medical decision making (see chart for details).  Clinical Course as of Jul 04 1006  Sun Jul 02, 2018  1943 Antibiotics given, the patient tolerated this very well.  She has no fever, no tachycardia, she is stable for discharge on clindamycin.  He is aware of the indications for return, she states she will be seeing a Theatre stage manager.   [BM]    Clinical Course User Index [BM] Noemi Chapel, MD   While the patient does appear to have a dental abscess I do not see any drainable collection of fluid.  She has normal mentation, normal ability to breathe tolerate secretions and speak with swallowing out without difficulty.  At this time I will give her IV clindamycin and change her antibiotic.  She will need close follow-up, she does have a family doctor with whom she follows very closely, Dr. Wolfgang Phoenix.  No signs of ludwig's.  Final Clinical Impressions(s) / ED Diagnoses   Final diagnoses:  Dental abscess  Facial swelling    ED Discharge Orders        Ordered    clindamycin (CLEOCIN) 150 MG capsule  3 times daily     07/02/18 1944    meloxicam (MOBIC) 7.5 MG tablet  Daily     07/02/18 1944       Noemi Chapel, MD 07/04/18 1007

## 2018-07-02 NOTE — Discharge Instructions (Signed)
Please follow-up with your family doctor within 48 hours for recheck if you are not getting better however if you develop increasing pain swelling difficulty swallowing speaking breathing or eating you should return to the emergency department immediately.  Clindamycin, 300 mg by mouth every 8 hours for the next 10 days.  He will need to see a dentist within the week to further evaluate as you will need to have your teeth pulled as you will continue to have infections if they do not come out.

## 2018-07-02 NOTE — ED Triage Notes (Signed)
Pt reports toothache for past several days.  Pt has been on penicillin since Friday.  Reports left sided facial swelling started yesterday afternoon but increased today.  Denies pain but says jaw is tender to touch.

## 2018-08-08 ENCOUNTER — Other Ambulatory Visit: Payer: Self-pay | Admitting: Family Medicine

## 2018-08-17 ENCOUNTER — Other Ambulatory Visit: Payer: Self-pay | Admitting: Family Medicine

## 2018-08-28 ENCOUNTER — Ambulatory Visit: Payer: Self-pay | Admitting: Cardiovascular Disease

## 2018-09-07 ENCOUNTER — Ambulatory Visit: Payer: Medicare Other | Admitting: Family Medicine

## 2018-09-11 ENCOUNTER — Telehealth: Payer: Self-pay | Admitting: Family Medicine

## 2018-09-11 DIAGNOSIS — I10 Essential (primary) hypertension: Secondary | ICD-10-CM | POA: Diagnosis not present

## 2018-09-11 DIAGNOSIS — E7849 Other hyperlipidemia: Secondary | ICD-10-CM | POA: Diagnosis not present

## 2018-09-11 DIAGNOSIS — R5383 Other fatigue: Secondary | ICD-10-CM | POA: Diagnosis not present

## 2018-09-11 NOTE — Telephone Encounter (Signed)
Pt needs refill on her HYDROcodone-acetaminophen (NORCO/VICODIN) 5-325 MG tablet  States she is out - her appt for 09/07/18 was cancelled due to docs schedule & pt is now rescheduled for 10/04/18  Pt wonders if we can refill until her appt  Please advise & call pt     [place]

## 2018-09-12 LAB — BASIC METABOLIC PANEL WITH GFR
BUN/Creatinine Ratio: 8 — ABNORMAL LOW (ref 9–23)
BUN: 7 mg/dL (ref 6–24)
CO2: 26 mmol/L (ref 20–29)
Calcium: 9.9 mg/dL (ref 8.7–10.2)
Chloride: 103 mmol/L (ref 96–106)
Creatinine, Ser: 0.92 mg/dL (ref 0.57–1.00)
GFR calc Af Amer: 79 mL/min/{1.73_m2}
GFR calc non Af Amer: 69 mL/min/{1.73_m2}
Glucose: 92 mg/dL (ref 65–99)
Potassium: 4.2 mmol/L (ref 3.5–5.2)
Sodium: 143 mmol/L (ref 134–144)

## 2018-09-12 LAB — CBC WITH DIFFERENTIAL/PLATELET
Basophils Absolute: 0 10*3/uL (ref 0.0–0.2)
Basos: 1 %
EOS (ABSOLUTE): 0.4 10*3/uL (ref 0.0–0.4)
EOS: 6 %
HEMOGLOBIN: 12 g/dL (ref 11.1–15.9)
Hematocrit: 37.3 % (ref 34.0–46.6)
IMMATURE GRANS (ABS): 0 10*3/uL (ref 0.0–0.1)
Immature Granulocytes: 0 %
LYMPHS ABS: 1.2 10*3/uL (ref 0.7–3.1)
LYMPHS: 19 %
MCH: 29.7 pg (ref 26.6–33.0)
MCHC: 32.2 g/dL (ref 31.5–35.7)
MCV: 92 fL (ref 79–97)
MONOCYTES: 10 %
Monocytes Absolute: 0.6 10*3/uL (ref 0.1–0.9)
NEUTROS ABS: 4.2 10*3/uL (ref 1.4–7.0)
Neutrophils: 64 %
PLATELETS: 174 10*3/uL (ref 150–450)
RBC: 4.04 x10E6/uL (ref 3.77–5.28)
RDW: 12.3 % (ref 12.3–15.4)
WBC: 6.5 10*3/uL (ref 3.4–10.8)

## 2018-09-12 LAB — LIPID PANEL
Chol/HDL Ratio: 3.2 ratio (ref 0.0–4.4)
Cholesterol, Total: 169 mg/dL (ref 100–199)
HDL: 53 mg/dL (ref >39)
LDL Calculated: 99 mg/dL (ref 0–99)
Triglycerides: 83 mg/dL (ref 0–149)
VLDL Cholesterol Cal: 17 mg/dL (ref 5–40)

## 2018-09-12 LAB — TSH: TSH: 1.36 u[IU]/mL (ref 0.450–4.500)

## 2018-09-12 LAB — HEPATIC FUNCTION PANEL
ALT: 27 [IU]/L (ref 0–32)
AST: 23 [IU]/L (ref 0–40)
Albumin: 4.4 g/dL (ref 3.5–5.5)
Alkaline Phosphatase: 88 [IU]/L (ref 39–117)
Bilirubin Total: 0.3 mg/dL (ref 0.0–1.2)
Bilirubin, Direct: 0.1 mg/dL (ref 0.00–0.40)
Total Protein: 7 g/dL (ref 6.0–8.5)

## 2018-09-12 NOTE — Telephone Encounter (Signed)
Please find out from her pharmacy when her last date of refill was and then please inform me and I can send in a month supply to last her

## 2018-09-13 ENCOUNTER — Other Ambulatory Visit: Payer: Self-pay | Admitting: Family Medicine

## 2018-09-13 MED ORDER — HYDROCODONE-ACETAMINOPHEN 5-325 MG PO TABS: 1.0000 | ORAL_TABLET | Freq: Three times a day (TID) | ORAL | 0 refills | Status: DC | PRN

## 2018-09-13 NOTE — Telephone Encounter (Signed)
Contacted patient and informed patient that a 30 day supply of med was sent in to [facility] and to keep follow up office visit in October. Pt verbalized understanding.

## 2018-09-13 NOTE — Telephone Encounter (Signed)
Last refill was 08/04/2018 for 90 tablets

## 2018-09-13 NOTE — Telephone Encounter (Signed)
Thank you Please let the patient know that I sent in a 30-day supply to [place] she can pick it up there Keep follow-up office visit in October

## 2018-09-23 ENCOUNTER — Other Ambulatory Visit: Payer: Self-pay | Admitting: Family Medicine

## 2018-09-25 ENCOUNTER — Other Ambulatory Visit: Payer: Self-pay

## 2018-09-25 MED ORDER — DICLOFENAC SODIUM 75 MG PO TBEC: 75.0000 mg | DELAYED_RELEASE_TABLET | Freq: Two times a day (BID) | ORAL | 2 refills | Status: DC

## 2018-09-25 MED ORDER — CARVEDILOL 3.125 MG PO TABS: 1.5000 mg | ORAL_TABLET | Freq: Two times a day (BID) | ORAL | 2 refills | Status: DC

## 2018-09-25 MED ORDER — LEVETIRACETAM 500 MG PO TABS: ORAL_TABLET | ORAL | 1 refills | Status: DC

## 2018-09-25 NOTE — Telephone Encounter (Signed)
Please refuse antibiotic the patient will need to call The other prescriptions may have 2 refills each

## 2018-10-04 ENCOUNTER — Ambulatory Visit (INDEPENDENT_AMBULATORY_CARE_PROVIDER_SITE_OTHER): Payer: Medicare Other | Admitting: Family Medicine

## 2018-10-04 ENCOUNTER — Encounter: Payer: Self-pay | Admitting: Family Medicine

## 2018-10-04 VITALS — BP 144/100 | Ht 68.5 in | Wt 171.0 lb

## 2018-10-04 DIAGNOSIS — I10 Essential (primary) hypertension: Secondary | ICD-10-CM | POA: Diagnosis not present

## 2018-10-04 DIAGNOSIS — E7849 Other hyperlipidemia: Secondary | ICD-10-CM

## 2018-10-04 DIAGNOSIS — K2100 Gastro-esophageal reflux disease with esophagitis, without bleeding: Secondary | ICD-10-CM

## 2018-10-04 DIAGNOSIS — Z79891 Long term (current) use of opiate analgesic: Secondary | ICD-10-CM

## 2018-10-04 MED ORDER — HYDROCODONE-ACETAMINOPHEN 5-325 MG PO TABS: 1.0000 | ORAL_TABLET | Freq: Four times a day (QID) | ORAL | 0 refills | Status: DC | PRN

## 2018-10-04 MED ORDER — HYDROCODONE-ACETAMINOPHEN 5-325 MG PO TABS: ORAL_TABLET | ORAL | 0 refills | Status: DC

## 2018-10-04 NOTE — Progress Notes (Addendum)
Subjective:    Patient ID: Tara Dickson, female    DOB: 09/12/1961, 57 y.o.   MRN: [id number]  HPI  This patient was seen today for chronic pain  The medication list was reviewed and updated.   -Compliance with medication: Yes  - Number patient states they take daily: 3 - 4  -when was the last dose patient took? Two days ago  The patient was advised the importance of maintaining medication and not using illegal substances with these.  Here for refills and follow up  The patient was educated that we can provide 3 monthly scripts for their medication, it is their responsibility to follow the instructions.  Side effects or complications from medications: None  Patient is aware that pain medications are meant to minimize the severity of the pain to allow their pain levels to improve to allow for better function. They are aware of that pain medications cannot totally remove their pain.  Due for UDT ( at least once per year) : Patient will do today   Patient is also her for her chronic health issues. She state she does not know which strength lisinopril she is on since we have it listed as 5mg  and 10 mg.   Patient for blood pressure check up.  The patient does have hypertension.  The patient is on medication.  Patient relates compliance with meds. Todays BP reviewed with the patient. Patient denies issues with medication. Patient relates reasonable diet. Patient tries to minimize salt. Patient aware of BP goals.  Patient does have ongoing trouble with reflux.  Takes medication on a regular basis.  Tries to minimize foods as best they can.  They understand the importance of dietary compliance.  May also try to avoid eating a large meal close to bedtime.  Patient denies any dysphagia denies hematochezia.  States medicine does a good job keeping the problem under good control.  Without the medication may certainly have issues.They desire to continue taking their medication.  Patient  here for follow-up regarding cholesterol.  The patient does have hyperlipidemia.  Patient does try to maintain a reasonable diet.  Patient does take the medication on a regular basis.  Denies missing a dose.  The patient denies any obvious side effects.  Prior blood work results reviewed with the patient.  The patient is aware of his cholesterol goals and the need to keep it under good control to lessen the risk of disease.  Patient states that her pain levels are set to where she states the pain medicine only last about 3 to 4 hours she really feels she could benefit from 4 tablets a day rather than 3 tablets a day she is asking for [place] to bump this up I did check a drug registry she is faithful with that previous urine drug screens did show marijuana she is been counseled to avoid that Review of Systems  Constitutional: Negative for activity change, appetite change and fatigue.  HENT: Negative for congestion and rhinorrhea.   Respiratory: Negative for cough and shortness of breath.   Cardiovascular: Negative for chest pain and leg swelling.  Gastrointestinal: Negative for abdominal pain and diarrhea.  Endocrine: Negative for polydipsia and polyphagia.  Skin: Negative for color change.  Neurological: Negative for dizziness and weakness.  Psychiatric/Behavioral: Negative for behavioral problems and confusion.       Objective:   Physical Exam  Constitutional: She appears well-nourished. No distress.  HENT:  Head: Normocephalic and atraumatic.  Eyes: Right eye  exhibits no discharge. Left eye exhibits no discharge.  Neck: No tracheal deviation present.  Cardiovascular: Normal rate, regular rhythm and normal heart sounds.  No murmur heard. Pulmonary/Chest: Effort normal and breath sounds normal. No respiratory distress.  Musculoskeletal: She exhibits no edema.  Lymphadenopathy:    She has no cervical adenopathy.  Neurological: She is alert. Coordination normal.  Skin: Skin is warm and dry.    Psychiatric: She has a normal mood and affect. Her behavior is normal.  Vitals reviewed.   Subjective low back discomfort to palpation negative straight leg raise decent range of motion      Assessment & Plan:  The patient was seen in followup for chronic pain. A review over at their current pain status was discussed. Drug registry was checked. Prescriptions were given. Discussion was held regarding the importance of compliance with medication as well as pain medication contract. Pain medications were bumped up, 4 tablets daily Recheck patient in 3 months  Time for questions regarding pain management plan occurred. Importance of regular followup visits was discussed. Patient was informed that medication may cause drowsiness and should not be combined  with other medications/alcohol or street drugs. Patient was cautioned that medication could cause drowsiness. If the patient feels medication is causing altered alertness then do not drive or operate dangerous equipment.  Drug registry was checked 3 prescriptions were sent in Urine drug screen pending   The patient was seen today as part of an evaluation regarding hyperlipidemia.  Recent lab work has been reviewed with the patient as well as the goals for good cholesterol care.  In addition to this medications have been discussed the importance of compliance with diet and medications discussed as well.  Finally the patient is aware that poor control of cholesterol, noncompliance can dramatically increase the risk of complications. The patient will keep regular office visits and the patient does agreed to periodic lab work. Lab work was reviewed with patient  HTN- Patient was seen today as part of a visit regarding hypertension. The importance of healthy diet and regular physical activity was discussed. The importance of compliance with medications discussed.  Ideal goal is to keep blood pressure low elevated levels certainly below  176/16 when possible.  The patient was counseled that keeping blood pressure under control lessen his risk of complications.  The importance of regular follow-ups was discussed with the patient.  Low-salt diet such as DASH recommended.  Regular physical activity was recommended as well.  Patient was advised to keep regular follow-ups. Blood pressure doing well continue current medications no sign of heart failure currently  Reflux related symptoms under good control with omeprazole  25 minutes was spent with the patient.  This statement verifies that 25 minutes was indeed spent with the patient.  More than 50% of this visit-total duration of the visit-was spent in counseling and coordination of care. The issues that the patient came in for today as reflected in the diagnosis (s) please refer to documentation for further details.  The patient was shown stretching exercises to do I do not feel she would benefit from physical therapy she cannot afford either also I do not feel she would visit for MRI at this point

## 2018-10-04 NOTE — Patient Instructions (Addendum)
Back Exercises If you have pain in your back, do these exercises 2-3 times each day or as told by your doctor. When the pain goes away, do the exercises once each day, but repeat the steps more times for each exercise (do more repetitions). If you do not have pain in your back, do these exercises once each day or as told by your doctor. Exercises Single Knee to Chest  Do these steps 3-5 times in a row for each leg: 1. Lie on your back on a firm bed or the floor with your legs stretched out. 2. Bring one knee to your chest. 3. Hold your knee to your chest by grabbing your knee or thigh. 4. Pull on your knee until you feel a gentle stretch in your lower back. 5. Keep doing the stretch for 10-30 seconds. 6. Slowly let go of your leg and straighten it.  Pelvic Tilt  Do these steps 5-10 times in a row: 1. Lie on your back on a firm bed or the floor with your legs stretched out. 2. Bend your knees so they point up to the ceiling. Your feet should be flat on the floor. 3. Tighten your lower belly (abdomen) muscles to press your lower back against the floor. This will make your tailbone point up to the ceiling instead of pointing down to your feet or the floor. 4. Stay in this position for 5-10 seconds while you gently tighten your muscles and breathe evenly.  Cat-Cow  Do these steps until your lower back bends more easily: 1. Get on your hands and knees on a firm surface. Keep your hands under your shoulders, and keep your knees under your hips. You may put padding under your knees. 2. Let your head hang down, and make your tailbone point down to the floor so your lower back is round like the back of a cat. 3. Stay in this position for 5 seconds. 4. Slowly lift your head and make your tailbone point up to the ceiling so your back hangs low (sags) like the back of a cow. 5. Stay in this position for 5 seconds.  Press-Ups  Do these steps 5-10 times in a row: 1. Lie on your belly (face-down)  on the floor. 2. Place your hands near your head, about shoulder-width apart. 3. While you keep your back relaxed and keep your hips on the floor, slowly straighten your arms to raise the top half of your body and lift your shoulders. Do not use your back muscles. To make yourself more comfortable, you may change where you place your hands. 4. Stay in this position for 5 seconds. 5. Slowly return to lying flat on the floor.  Bridges  Do these steps 10 times in a row: 1. Lie on your back on a firm surface. 2. Bend your knees so they point up to the ceiling. Your feet should be flat on the floor. 3. Tighten your butt muscles and lift your butt off of the floor until your waist is almost as high as your knees. If you do not feel the muscles working in your butt and the back of your thighs, slide your feet 1-2 inches farther away from your butt. 4. Stay in this position for 3-5 seconds. 5. Slowly lower your butt to the floor, and let your butt muscles relax.  If this exercise is too easy, try doing it with your arms crossed over your chest. Belly Crunches  Do these steps 5-10 times in   a row: 1. Lie on your back on a firm bed or the floor with your legs stretched out. 2. Bend your knees so they point up to the ceiling. Your feet should be flat on the floor. 3. Cross your arms over your chest. 4. Tip your chin a little bit toward your chest but do not bend your neck. 5. Tighten your belly muscles and slowly raise your chest just enough to lift your shoulder blades a tiny bit off of the floor. 6. Slowly lower your chest and your head to the floor.  Back Lifts Do these steps 5-10 times in a row: 1. Lie on your belly (face-down) with your arms at your sides, and rest your forehead on the floor. 2. Tighten the muscles in your legs and your butt. 3. Slowly lift your chest off of the floor while you keep your hips on the floor. Keep the back of your head in line with the curve in your back. Look at  the floor while you do this. 4. Stay in this position for 3-5 seconds. 5. Slowly lower your chest and your face to the floor.  Contact a doctor if:  Your back pain gets a lot worse when you do an exercise.  Your back pain does not lessen 2 hours after you exercise. If you have any of these problems, stop doing the exercises. Do not do them again unless your doctor says it is okay. Get help right away if:  You have sudden, very bad back pain. If this happens, stop doing the exercises. Do not do them again unless your doctor says it is okay. This information is not intended to replace advice given to you by your health care provider. Make sure you discuss any questions you have with your health care provider. Document Released: 01/08/2011 Document Revised: 05/13/2016 Document Reviewed: 01/30/2015 Elsevier Interactive Patient Education  2018 [employer].  Results for orders placed or performed during the hospital encounter of 06/26/18  NM Myocar Multi W/Spect W/Wall Motion / EF  Result Value Ref Range   Rest HR 46 bpm   Rest BP 148/99 mmHg   Peak HR 90 bpm   Peak BP 167/89 mmHg   SSS 0    SRS 0    SDS 0    LHR 0.27    TID 0.99    LV sys vol 49 mL   LV dias vol 99 46 - 106 mL

## 2018-10-05 ENCOUNTER — Other Ambulatory Visit: Payer: Self-pay | Admitting: Family Medicine

## 2018-10-10 LAB — TOXASSURE SELECT 13 (MW), URINE

## 2018-10-18 ENCOUNTER — Ambulatory Visit: Payer: Self-pay | Admitting: Cardiovascular Disease

## 2018-10-18 DIAGNOSIS — R0989 Other specified symptoms and signs involving the circulatory and respiratory systems: Secondary | ICD-10-CM

## 2018-10-19 ENCOUNTER — Encounter: Payer: Self-pay | Admitting: Cardiovascular Disease

## 2018-10-24 ENCOUNTER — Other Ambulatory Visit: Payer: Self-pay | Admitting: Family Medicine

## 2018-11-09 ENCOUNTER — Encounter: Payer: Self-pay | Admitting: Family Medicine

## 2018-11-09 ENCOUNTER — Ambulatory Visit (INDEPENDENT_AMBULATORY_CARE_PROVIDER_SITE_OTHER): Payer: Medicare Other | Admitting: Family Medicine

## 2018-11-09 DIAGNOSIS — G8929 Other chronic pain: Secondary | ICD-10-CM | POA: Diagnosis not present

## 2018-11-09 DIAGNOSIS — M545 Low back pain: Secondary | ICD-10-CM

## 2018-11-09 NOTE — Progress Notes (Signed)
   Subjective:    Patient ID: Tara Dickson, female    DOB: [birth year], 57 y.o.   MRN: [id number]  HPI  Patient arrives to discuss results of recent urine drug test. We had a long discussion today regarding how her urine drug screen did show metabolites of cocaine She admits to smoking marijuana but states she has not been using cocaine I told her we could offer to set her up with a pain management center in [place] but we would not be able to prescribe her pain medicine ongoing she understood this I will allow for her next prescription of pain medicine to be filled but I instructed her how to taper off of her hydrocodone over the next 4 weeks Also instructed her to stay away from any illegal drugs Also instructed her not to drive if she feels drowsy Review of Systems     Objective:   Physical Exam   Patient will keep her standard follow-up appointment in January 15 minutes was spent with patient today discussing healthcare issues which they came.  More than 50% of this visit-total duration of visit-was spent in counseling and coordination of care.  Please see diagnosis regarding the focus of this coordination and care      Assessment & Plan:  We will not be able to prescribe her pain medication ongoing  We will cancel the third prescription that could be filled in December

## 2018-12-25 ENCOUNTER — Other Ambulatory Visit: Payer: Self-pay | Admitting: Family Medicine

## 2018-12-25 NOTE — Telephone Encounter (Signed)
Refill all x 6 months 

## 2019-01-08 ENCOUNTER — Ambulatory Visit: Payer: Medicare Other | Admitting: Family Medicine

## 2019-03-23 ENCOUNTER — Other Ambulatory Visit: Payer: Self-pay | Admitting: Family Medicine

## 2019-03-25 NOTE — Telephone Encounter (Signed)
May have 90 days on each

## 2019-05-24 ENCOUNTER — Other Ambulatory Visit: Payer: Self-pay | Admitting: Family Medicine

## 2019-05-24 NOTE — Telephone Encounter (Signed)
Please contact patient to set up appt. Once appt made, please route back to pool. Thank you

## 2019-05-24 NOTE — Telephone Encounter (Signed)
Schedule virtual visit May have 1 month refill on each to cover until at the time of her visit

## 2019-05-25 NOTE — Telephone Encounter (Signed)
Pt has appt set for June 16th

## 2019-05-27 ENCOUNTER — Other Ambulatory Visit: Payer: Self-pay | Admitting: Family Medicine

## 2019-06-05 ENCOUNTER — Other Ambulatory Visit: Payer: Self-pay

## 2019-06-05 ENCOUNTER — Ambulatory Visit (INDEPENDENT_AMBULATORY_CARE_PROVIDER_SITE_OTHER): Payer: Medicare Other | Admitting: Family Medicine

## 2019-06-05 DIAGNOSIS — K2100 Gastro-esophageal reflux disease with esophagitis, without bleeding: Secondary | ICD-10-CM

## 2019-06-05 DIAGNOSIS — G40909 Epilepsy, unspecified, not intractable, without status epilepticus: Secondary | ICD-10-CM

## 2019-06-05 DIAGNOSIS — I10 Essential (primary) hypertension: Secondary | ICD-10-CM

## 2019-06-05 DIAGNOSIS — E7849 Other hyperlipidemia: Secondary | ICD-10-CM

## 2019-06-05 DIAGNOSIS — F3176 Bipolar disorder, in full remission, most recent episode depressed: Secondary | ICD-10-CM

## 2019-06-05 DIAGNOSIS — R7303 Prediabetes: Secondary | ICD-10-CM | POA: Diagnosis not present

## 2019-06-05 MED ORDER — CARVEDILOL 3.125 MG PO TABS: 3.1250 mg | ORAL_TABLET | Freq: Two times a day (BID) | ORAL | 1 refills | Status: DC

## 2019-06-05 MED ORDER — ATORVASTATIN CALCIUM 40 MG PO TABS: 40.0000 mg | ORAL_TABLET | Freq: Every day | ORAL | 1 refills | Status: DC

## 2019-06-05 MED ORDER — OMEPRAZOLE 40 MG PO CPDR: 40.0000 mg | DELAYED_RELEASE_CAPSULE | Freq: Every day | ORAL | 1 refills | Status: DC

## 2019-06-05 MED ORDER — LEVETIRACETAM 500 MG PO TABS: ORAL_TABLET | ORAL | 1 refills | Status: DC

## 2019-06-05 MED ORDER — LISINOPRIL 10 MG PO TABS: 10.0000 mg | ORAL_TABLET | Freq: Every day | ORAL | 1 refills | Status: DC

## 2019-06-05 MED ORDER — DULOXETINE HCL 60 MG PO CPEP: 60.0000 mg | ORAL_CAPSULE | Freq: Every day | ORAL | 1 refills | Status: DC

## 2019-06-05 NOTE — Progress Notes (Signed)
Subjective:    Patient ID: Tara Dickson, female    DOB: 11/03/1961, 58 y.o.   MRN: [id number]  HPI Pt here for med check. Pt states she needs 90 day supplies on all chronic meds. No other issues or concerns at this time. Patient has history of seizure disorder she does take her medicine on a regular basis has not had any seizures recently History of hyperlipidemia watch his diet closely history of heart disease recommend follow-up on lab work later this year Reflux related issues under good control recently with medications Patient has chronic pain discomfort no longer on pain medicine with our practice uses Tylenol or anti-inflammatories Patient does have a history of bipolar her moods been under good control taking her medications.  States depression been under good control  Virtual Visit via Video Note  I connected with Tara Dickson on 06/05/19 at 10:00 AM EDT by a video enabled telemedicine application and verified that I am speaking with the correct person using two identifiers.  Location: Patient: home Provider: office   I discussed the limitations of evaluation and management by telemedicine and the availability of in person appointments. The patient expressed understanding and agreed to proceed.  History of Present Illness:    Observations/Objective:   Assessment and Plan:   Follow Up Instructions:    I discussed the assessment and treatment plan with the patient. The patient was provided an opportunity to ask questions and all were answered. The patient agreed with the plan and demonstrated an understanding of the instructions.   The patient was advised to call back or seek an in-person evaluation if the symptoms worsen or if the condition fails to improve as anticipated.  I provided 20 minutes of non-face-to-face time during this encounter.   Tara Males, LPN    Review of Systems  Constitutional: Negative for activity change, appetite change and  fatigue.  HENT: Negative for congestion and rhinorrhea.   Respiratory: Negative for cough and shortness of breath.   Cardiovascular: Negative for chest pain and leg swelling.  Gastrointestinal: Negative for abdominal pain and diarrhea.  Endocrine: Negative for polydipsia and polyphagia.  Skin: Negative for color change.  Neurological: Negative for dizziness and weakness.  Psychiatric/Behavioral: Negative for behavioral problems and confusion.       Objective:   Physical Exam Patient had virtual visit Appears to be in no distress Atraumatic Neuro able to relate and oriented No apparent resp distress Color normal        Assessment & Plan:  1. HTN (hypertension), benign HTN- Patient was seen today as part of a visit regarding hypertension. The importance of healthy diet and regular physical activity was discussed. The importance of compliance with medications discussed.  Ideal goal is to keep blood pressure low elevated levels certainly below 115/72 when possible.  The patient was counseled that keeping blood pressure under control lessen his risk of complications.  The importance of regular follow-ups was discussed with the patient.  Low-salt diet such as DASH recommended.  Regular physical activity was recommended as well.  Patient was advised to keep regular follow-ups.  - Basic Metabolic Panel (BMET) - Lipid Profile - Hepatic function panel - CBC with Differential  2. Seizure disorder ([place]) Under good control taking medication continue current measures  3. Prediabetes Watches starches in the diet tries to stay active - Basic Metabolic Panel (BMET) - Lipid Profile - Hepatic function panel - CBC with Differential  4. Other hyperlipidemia Has hyperlipidemia takes  medication continue medication check new lab work - Biomedical engineer Panel (BMET) - Lipid Profile - Hepatic function panel - CBC with Differential  5. Bipolar disorder, in full remission, most recent  episode depressed ([place]) Moods are under good control continue current antidepressant 25 minutes was spent with the patient.  This statement verifies that 25 minutes was indeed spent with the patient.  More than 50% of this visit-total duration of the visit-was spent in counseling and coordination of care. The issues that the patient came in for today as reflected in the diagnosis (s) please refer to documentation for further details.  6. Reflux esophagitis Reflux under good control uses medication on a regular basis  Patient has history of heart disease denies any chest tightness pressure pain or shortness of breath risk factor management recommended

## 2019-10-11 ENCOUNTER — Encounter: Payer: Self-pay | Admitting: Family Medicine

## 2019-10-11 ENCOUNTER — Ambulatory Visit (INDEPENDENT_AMBULATORY_CARE_PROVIDER_SITE_OTHER): Payer: Medicare Other | Admitting: Family Medicine

## 2019-10-11 VITALS — BP 189/82

## 2019-10-11 DIAGNOSIS — I10 Essential (primary) hypertension: Secondary | ICD-10-CM

## 2019-10-11 NOTE — Progress Notes (Signed)
   Subjective:    Patient ID: Tara Dickson, female    DOB: Apr 09, 1961, 58 y.o.   MRN: [id number]  Hypertension This is a chronic problem. Pertinent negatives include no chest pain or shortness of breath. There are no compliance problems.   pt states her father was recently placed in Hospice. Pt states she did get the lady across the street to check her blood pressure yesterday. Pt has no concerns or issues at this time.  Patient blood pressure issues Takes her medicine regular basis Minimizes salt in the diet States that her blood pressure recently elevated because of the stress of having her dad in hospice Recently he had a stroke. Virtual Visit via Video Note  I connected with Simeon Craft on 10/11/19 at  9:30 AM EDT by a video enabled telemedicine application and verified that I am speaking with the correct person using two identifiers.  Location: Patient: home Provider: office   I discussed the limitations of evaluation and management by telemedicine and the availability of in person appointments. The patient expressed understanding and agreed to proceed.  History of Present Illness:    Observations/Objective:   Assessment and Plan:   Follow Up Instructions:    I discussed the assessment and treatment plan with the patient. The patient was provided an opportunity to ask questions and all were answered. The patient agreed with the plan and demonstrated an understanding of the instructions.   The patient was advised to call back or seek an in-person evaluation if the symptoms worsen or if the condition fails to improve as anticipated.  I provided 15 minutes of non-face-to-face time during this encounter.   Vicente Males, LPN     Review of Systems  Constitutional: Negative for activity change, appetite change and fatigue.  HENT: Negative for congestion and rhinorrhea.   Respiratory: Negative for cough and shortness of breath.   Cardiovascular: Negative for  chest pain and leg swelling.  Gastrointestinal: Negative for abdominal pain and diarrhea.  Endocrine: Negative for polydipsia and polyphagia.  Skin: Negative for color change.  Neurological: Negative for dizziness and weakness.  Psychiatric/Behavioral: Negative for behavioral problems and confusion.  She relates feeling anxious and nervous     Objective:   Physical Exam   Today's visit was via telephone Physical exam was not possible for this visit      Assessment & Plan:  HTN Patient is trying to the best she can at watching her diet and taking her medicines Minimize salt Blood pressure is higher than what we like to see she states she is just very emotionally upset because her dad is in hospice she will follow-up with [place] in a few weeks, let us recheck she will let us know sooner if it continues to stay elevated

## 2019-11-06 ENCOUNTER — Ambulatory Visit (INDEPENDENT_AMBULATORY_CARE_PROVIDER_SITE_OTHER): Payer: Medicare Other | Admitting: Family Medicine

## 2019-11-06 ENCOUNTER — Other Ambulatory Visit: Payer: Self-pay

## 2019-11-06 VITALS — BP 122/78 | Temp 98.2°F | Ht 68.5 in | Wt 174.8 lb

## 2019-11-06 DIAGNOSIS — R252 Cramp and spasm: Secondary | ICD-10-CM

## 2019-11-06 DIAGNOSIS — Z23 Encounter for immunization: Secondary | ICD-10-CM | POA: Diagnosis not present

## 2019-11-06 DIAGNOSIS — E7849 Other hyperlipidemia: Secondary | ICD-10-CM | POA: Diagnosis not present

## 2019-11-06 DIAGNOSIS — I10 Essential (primary) hypertension: Secondary | ICD-10-CM

## 2019-11-06 MED ORDER — METOPROLOL SUCCINATE ER 25 MG PO TB24: 25.0000 mg | ORAL_TABLET | Freq: Every day | ORAL | 1 refills | Status: DC

## 2019-11-06 MED ORDER — DULOXETINE HCL 60 MG PO CPEP: 60.0000 mg | ORAL_CAPSULE | Freq: Every day | ORAL | 1 refills | Status: DC

## 2019-11-06 MED ORDER — ATORVASTATIN CALCIUM 40 MG PO TABS: 40.0000 mg | ORAL_TABLET | Freq: Every day | ORAL | 1 refills | Status: DC

## 2019-11-06 MED ORDER — LISINOPRIL 10 MG PO TABS: 10.0000 mg | ORAL_TABLET | Freq: Every day | ORAL | 1 refills | Status: DC

## 2019-11-06 MED ORDER — LEVETIRACETAM 500 MG PO TABS: ORAL_TABLET | ORAL | 1 refills | Status: DC

## 2019-11-06 MED ORDER — OMEPRAZOLE 40 MG PO CPDR: 40.0000 mg | DELAYED_RELEASE_CAPSULE | Freq: Every day | ORAL | 1 refills | Status: DC

## 2019-11-06 NOTE — Progress Notes (Signed)
   Subjective:    Patient ID: Tara Dickson, female    DOB: [birth year], 58 y.o.   MRN: [id number]  Hypertension This is a chronic problem. Pertinent negatives include no chest pain or shortness of breath. Treatments tried: lisinopril, carvedilol. Compliance problems: pt states she ran out of bp meds and just started back this morning.    Pt states she is having some muscle cramps for the past two weeks. Mostly at night.  What the patient describes sounds more like muscle spasms which could be related to low potassium patient has not had lab work recently  She does try to be careful with her diet.  She does admit she smokes marijuana but does not use other drugs.  Patient stays away from cigarette smoke.  She also stays away from alcohol She relates she has a hard time cutting carvedilol very small pill cannot cut it well Review of Systems  Constitutional: Negative for activity change, appetite change and fatigue.  HENT: Negative for congestion and rhinorrhea.   Respiratory: Negative for cough and shortness of breath.   Cardiovascular: Negative for chest pain and leg swelling.  Gastrointestinal: Negative for abdominal pain and diarrhea.  Endocrine: Negative for polydipsia and polyphagia.  Skin: Negative for color change.  Neurological: Negative for dizziness and weakness.  Psychiatric/Behavioral: Negative for behavioral problems and confusion.       Objective:   Physical Exam Vitals signs reviewed.  Constitutional:      General: She is not in acute distress. HENT:     Head: Normocephalic and atraumatic.  Eyes:     General:        Right eye: No discharge.        Left eye: No discharge.  Neck:     Trachea: No tracheal deviation.  Cardiovascular:     Rate and Rhythm: Normal rate and regular rhythm.     Heart sounds: Normal heart sounds. No murmur.  Pulmonary:     Effort: Pulmonary effort is normal. No respiratory distress.     Breath sounds: Normal breath sounds.   Lymphadenopathy:     Cervical: No cervical adenopathy.  Skin:    General: Skin is warm and dry.  Neurological:     Mental Status: She is alert.     Coordination: Coordination normal.  Psychiatric:        Behavior: Behavior normal.           Assessment & Plan:  HTN good control her weight has gone up watch diet stay active eat healthy continue current medications. Because carvedilol is difficult to cut change it to metoprolol 25 mg 1 daily  No seizures lately taking her medication faithfully  Hyperlipidemia patient does need to check lab work.  Very important for the patient to eat healthy.  Continue medication.  GERD under good control continue current measures.  Depression under good control continue duloxetine.

## 2020-02-04 ENCOUNTER — Encounter: Payer: Self-pay | Admitting: Internal Medicine

## 2020-07-01 ENCOUNTER — Other Ambulatory Visit: Payer: Self-pay | Admitting: Family Medicine

## 2020-07-01 NOTE — Telephone Encounter (Signed)
Schedule office visit May have 90-day on each

## 2020-07-02 NOTE — Telephone Encounter (Signed)
Please schedule and then route back.  

## 2020-07-03 NOTE — Telephone Encounter (Signed)
Attempted to call pt, vm not set up  

## 2020-07-03 NOTE — Telephone Encounter (Signed)
Tried only number in chart and the number is in correct.

## 2020-08-27 ENCOUNTER — Encounter: Payer: Self-pay | Admitting: Internal Medicine

## 2020-09-22 DIAGNOSIS — Z1231 Encounter for screening mammogram for malignant neoplasm of breast: Secondary | ICD-10-CM | POA: Diagnosis not present

## 2020-10-02 ENCOUNTER — Other Ambulatory Visit: Payer: Self-pay | Admitting: Family Medicine

## 2020-10-03 NOTE — Telephone Encounter (Signed)
Eft message to schedule office visit

## 2020-10-06 NOTE — Telephone Encounter (Signed)
Patient does not need refills.  She just got her meds refilled and has an appt in 2 weeks for f/u. So disregard this message.

## 2020-10-24 ENCOUNTER — Ambulatory Visit (INDEPENDENT_AMBULATORY_CARE_PROVIDER_SITE_OTHER): Payer: Medicare Other | Admitting: Family Medicine

## 2020-10-24 ENCOUNTER — Encounter: Payer: Self-pay | Admitting: Family Medicine

## 2020-10-24 ENCOUNTER — Other Ambulatory Visit: Payer: Self-pay

## 2020-10-24 VITALS — BP 134/78 | HR 74 | Temp 97.0°F | Wt 180.6 lb

## 2020-10-24 DIAGNOSIS — E7849 Other hyperlipidemia: Secondary | ICD-10-CM

## 2020-10-24 DIAGNOSIS — I10 Essential (primary) hypertension: Secondary | ICD-10-CM | POA: Diagnosis not present

## 2020-10-24 DIAGNOSIS — Z23 Encounter for immunization: Secondary | ICD-10-CM | POA: Diagnosis not present

## 2020-10-24 DIAGNOSIS — D649 Anemia, unspecified: Secondary | ICD-10-CM | POA: Diagnosis not present

## 2020-10-24 DIAGNOSIS — G40909 Epilepsy, unspecified, not intractable, without status epilepticus: Secondary | ICD-10-CM

## 2020-10-24 MED ORDER — DULOXETINE HCL 60 MG PO CPEP
60.0000 mg | ORAL_CAPSULE | Freq: Every day | ORAL | 1 refills | Status: DC
Start: 2020-10-24 — End: 2021-03-19

## 2020-10-24 MED ORDER — LISINOPRIL 10 MG PO TABS
10.0000 mg | ORAL_TABLET | Freq: Every day | ORAL | 1 refills | Status: DC
Start: 2020-10-24 — End: 2021-03-19

## 2020-10-24 MED ORDER — OMEPRAZOLE 40 MG PO CPDR
40.0000 mg | DELAYED_RELEASE_CAPSULE | Freq: Every day | ORAL | 3 refills | Status: DC
Start: 2020-10-24 — End: 2021-07-07

## 2020-10-24 MED ORDER — METOPROLOL SUCCINATE ER 25 MG PO TB24
25.0000 mg | ORAL_TABLET | Freq: Every day | ORAL | 1 refills | Status: DC
Start: 2020-10-24 — End: 2021-03-19

## 2020-10-24 MED ORDER — ATORVASTATIN CALCIUM 40 MG PO TABS
40.0000 mg | ORAL_TABLET | Freq: Every day | ORAL | 1 refills | Status: DC
Start: 2020-10-24 — End: 2021-03-19

## 2020-10-24 MED ORDER — LEVETIRACETAM 500 MG PO TABS
ORAL_TABLET | ORAL | 1 refills | Status: DC
Start: 2020-10-24 — End: 2021-03-19

## 2020-10-24 NOTE — Progress Notes (Signed)
   Subjective:    Patient ID: Tara Dickson, female    DOB: 1961/03/15, 59 y.o.   MRN: [id number]  Hypertension This is a chronic problem. Pertinent negatives include no chest pain, headaches or shortness of breath. There are no compliance problems.   Pt having no issues with blood pressure. Pt checks blood pressure regularly. Taking meds as directed.    Pt having issues with allergies. States that some mornings when she wakes up her eyes are puffy and she sneezes almost constantly. Has been taking OTC med but no relief.   Seizure disorder ([place]) - Plan: Lipid Profile, Comprehensive Metabolic Panel (CMET)  HTN (hypertension), benign - Plan: Lipid Profile, Comprehensive Metabolic Panel (CMET)  Other hyperlipidemia - Plan: Lipid Profile, Comprehensive Metabolic Panel (CMET)  Normocytic anemia - Plan: Lipid Profile, Comprehensive Metabolic Panel (CMET)  Need for vaccination - Plan: Flu Vaccine QUAD 6+ mos PF IM (Fluarix Quad PF)  This patient has underlying seizure disorder she is under good control takes her medicine has not had any flareups has not had any seizures.  Patient relates compliance with her medicine  Patient takes her blood pressure medicine on a regular basis.  She minimizes salt in her diet.  Tries to eat somewhat healthy.  Patient has a history of back pain and sciatica symptoms also has a history of stress related issues and depression related losses she states she is working through this.  Takes her medicine regular basis.  Reflux under decent control denies any setbacks with that.  Review of Systems  Constitutional: Negative for activity change, fatigue and fever.  HENT: Negative for congestion and rhinorrhea.   Respiratory: Negative for cough, chest tightness and shortness of breath.   Cardiovascular: Negative for chest pain and leg swelling.  Gastrointestinal: Negative for abdominal pain and nausea.  Skin: Negative for color change.  Neurological: Negative for  dizziness and headaches.  Psychiatric/Behavioral: Negative for agitation and behavioral problems.       Objective:   Physical Exam Vitals reviewed.  Constitutional:      General: She is not in acute distress. HENT:     Head: Normocephalic.  Cardiovascular:     Rate and Rhythm: Normal rate and regular rhythm.     Heart sounds: Normal heart sounds. No murmur heard.   Pulmonary:     Effort: Pulmonary effort is normal.     Breath sounds: Normal breath sounds.  Lymphadenopathy:     Cervical: No cervical adenopathy.  Neurological:     Mental Status: She is alert.  Psychiatric:        Behavior: Behavior normal.           Assessment & Plan:  1. Seizure disorder ([place]) Continue medication no breakthrough seizures. - Lipid Profile - Comprehensive Metabolic Panel (CMET)  2. HTN (hypertension), benign Blood pressure good control minimize salt in diet stay active check lab work - Lipid Profile - Comprehensive Metabolic Panel (CMET)  3. Other hyperlipidemia History hyperlipidemia watch diet continue medication. - Lipid Profile - Comprehensive Metabolic Panel (CMET)  4. Normocytic anemia Previous CBC looks good we will hold off on doing 1 currently - Lipid Profile - Comprehensive Metabolic Panel (CMET)  5. Need for vaccination Flu shot today - Flu Vaccine QUAD 6+ mos PF IM (Fluarix Quad PF) Mild depression actually doing well currently working through her losses but she would like to continue the Cymbalta because it helps her moods as well as helping with her back pain and sciatica.

## 2021-03-15 ENCOUNTER — Other Ambulatory Visit: Payer: Self-pay | Admitting: Family Medicine

## 2021-03-15 DIAGNOSIS — E7849 Other hyperlipidemia: Secondary | ICD-10-CM

## 2021-03-15 DIAGNOSIS — Z79899 Other long term (current) drug therapy: Secondary | ICD-10-CM

## 2021-03-15 DIAGNOSIS — G40909 Epilepsy, unspecified, not intractable, without status epilepticus: Secondary | ICD-10-CM

## 2021-03-16 NOTE — Telephone Encounter (Signed)
May have 90 days Needs CBC, CMP, lipid because of hyperlipidemia medications seizure disorder Needs to keep appointment in early May as planned

## 2021-04-08 ENCOUNTER — Other Ambulatory Visit: Payer: Self-pay | Admitting: *Deleted

## 2021-04-08 DIAGNOSIS — Z1211 Encounter for screening for malignant neoplasm of colon: Secondary | ICD-10-CM

## 2021-04-13 ENCOUNTER — Encounter: Payer: Self-pay | Admitting: Internal Medicine

## 2021-04-23 ENCOUNTER — Ambulatory Visit: Payer: Medicare Other | Admitting: Family Medicine

## 2021-04-24 DIAGNOSIS — E7849 Other hyperlipidemia: Secondary | ICD-10-CM | POA: Diagnosis not present

## 2021-04-24 DIAGNOSIS — Z79899 Other long term (current) drug therapy: Secondary | ICD-10-CM | POA: Diagnosis not present

## 2021-04-25 LAB — COMPREHENSIVE METABOLIC PANEL
ALT: 10 IU/L (ref 0–32)
AST: 17 IU/L (ref 0–40)
Albumin/Globulin Ratio: 1.4 (ref 1.2–2.2)
Albumin: 4.2 g/dL (ref 3.8–4.9)
Alkaline Phosphatase: 70 IU/L (ref 44–121)
BUN/Creatinine Ratio: 10 — ABNORMAL LOW (ref 12–28)
BUN: 9 mg/dL (ref 8–27)
Bilirubin Total: 0.7 mg/dL (ref 0.0–1.2)
CO2: 24 mmol/L (ref 20–29)
Calcium: 9.8 mg/dL (ref 8.7–10.3)
Chloride: 104 mmol/L (ref 96–106)
Creatinine, Ser: 0.93 mg/dL (ref 0.57–1.00)
Globulin, Total: 3.1 g/dL (ref 1.5–4.5)
Glucose: 86 mg/dL (ref 65–99)
Potassium: 3.7 mmol/L (ref 3.5–5.2)
Sodium: 144 mmol/L (ref 134–144)
Total Protein: 7.3 g/dL (ref 6.0–8.5)
eGFR: 70 mL/min/{1.73_m2} (ref >59)

## 2021-04-25 LAB — CBC WITH DIFFERENTIAL/PLATELET
Basophils Absolute: 0 10*3/uL (ref 0.0–0.2)
Basos: 1 %
EOS (ABSOLUTE): 0.2 10*3/uL (ref 0.0–0.4)
Eos: 4 %
Hematocrit: 33.8 % — ABNORMAL LOW (ref 34.0–46.6)
Hemoglobin: 11.1 g/dL (ref 11.1–15.9)
Immature Grans (Abs): 0 10*3/uL (ref 0.0–0.1)
Immature Granulocytes: 0 %
Lymphocytes Absolute: 1.5 10*3/uL (ref 0.7–3.1)
Lymphs: 24 %
MCH: 29.8 pg (ref 26.6–33.0)
MCHC: 32.8 g/dL (ref 31.5–35.7)
MCV: 91 fL (ref 79–97)
Monocytes Absolute: 0.8 10*3/uL (ref 0.1–0.9)
Monocytes: 13 %
Neutrophils Absolute: 3.6 10*3/uL (ref 1.4–7.0)
Neutrophils: 58 %
Platelets: 161 10*3/uL (ref 150–450)
RBC: 3.73 x10E6/uL — ABNORMAL LOW (ref 3.77–5.28)
RDW: 12.2 % (ref 11.7–15.4)
WBC: 6.2 10*3/uL (ref 3.4–10.8)

## 2021-04-25 LAB — LIPID PANEL
Chol/HDL Ratio: 4.2 ratio (ref 0.0–4.4)
Cholesterol, Total: 194 mg/dL (ref 100–199)
HDL: 46 mg/dL (ref >39)
LDL Chol Calc (NIH): 132 mg/dL — ABNORMAL HIGH (ref 0–99)
Triglycerides: 88 mg/dL (ref 0–149)
VLDL Cholesterol Cal: 16 mg/dL (ref 5–40)

## 2021-05-01 ENCOUNTER — Encounter: Payer: Self-pay | Admitting: Family Medicine

## 2021-05-01 ENCOUNTER — Ambulatory Visit (INDEPENDENT_AMBULATORY_CARE_PROVIDER_SITE_OTHER): Payer: Medicare Other | Admitting: Family Medicine

## 2021-05-01 ENCOUNTER — Other Ambulatory Visit: Payer: Self-pay

## 2021-05-01 VITALS — BP 127/83 | HR 70 | Temp 97.2°F | Ht 68.5 in | Wt 167.0 lb

## 2021-05-01 DIAGNOSIS — I10 Essential (primary) hypertension: Secondary | ICD-10-CM | POA: Diagnosis not present

## 2021-05-01 DIAGNOSIS — E7849 Other hyperlipidemia: Secondary | ICD-10-CM | POA: Diagnosis not present

## 2021-05-01 DIAGNOSIS — G40909 Epilepsy, unspecified, not intractable, without status epilepticus: Secondary | ICD-10-CM

## 2021-05-01 MED ORDER — METOPROLOL SUCCINATE ER 25 MG PO TB24: 1.0000 | ORAL_TABLET | Freq: Every day | ORAL | 1 refills | Status: DC

## 2021-05-01 MED ORDER — ATORVASTATIN CALCIUM 40 MG PO TABS: 1.0000 | ORAL_TABLET | Freq: Every day | ORAL | 1 refills | Status: DC

## 2021-05-01 MED ORDER — DULOXETINE HCL 60 MG PO CPEP: 60.0000 mg | ORAL_CAPSULE | Freq: Every day | ORAL | 1 refills | Status: DC

## 2021-05-01 MED ORDER — LEVETIRACETAM 500 MG PO TABS: 500.0000 mg | ORAL_TABLET | Freq: Two times a day (BID) | ORAL | 1 refills | Status: DC

## 2021-05-01 MED ORDER — LISINOPRIL 10 MG PO TABS: 1.0000 | ORAL_TABLET | Freq: Every day | ORAL | 1 refills | Status: DC

## 2021-05-01 NOTE — Progress Notes (Addendum)
   Subjective:    Patient ID: Tara Dickson, female    DOB: 1961/05/02, 60 y.o.   MRN: [id number]  Hyperlipidemia This is a chronic problem. Treatments tried: atorvastatin. There are no compliance problems (takes meds every day, tries to eat healthy, doing more exercise since weather is getting warm).    HTN (hypertension), benign  Seizure disorder ([place])  Other hyperlipidemia       Patient denies any seizures currently. States her moods overall are doing relatively well Denies being depressed currently not suicidal.   Review of Systems     Objective:   Physical Exam  Lungs clear heart regular pulse normal BP good      Assessment & Plan:  1. HTN (hypertension), benign Blood pressure good control continue current measures watch diet closely  2. Seizure disorder ([place]) No seizures recently taking her medicines follow-up in 6 months  3. Other hyperlipidemia Takes her cholesterol medicine and has gone up some she will work hard on diet we will relook again in 6 months

## 2021-05-04 ENCOUNTER — Telehealth: Payer: Self-pay

## 2021-05-04 NOTE — Telephone Encounter (Signed)
Pt notified and verbalized understanding.

## 2021-05-04 NOTE — Telephone Encounter (Signed)
Lisinopril 

## 2021-05-04 NOTE — Telephone Encounter (Signed)
Patient was here on Friday and said that Dr. Nicki Reaper told her to take her BP meds around dinner time but she doesn't know which medication that is.

## 2021-07-07 ENCOUNTER — Other Ambulatory Visit: Payer: Self-pay | Admitting: Family Medicine

## 2021-07-08 NOTE — Progress Notes (Signed)
Primary Care Physician:  Kathyrn Drown, MD Primary Gastroenterologist:  Dr. Gala Romney  Chief Complaint  Patient presents with   Colonoscopy    Due for tcs    HPI:   Tara Dickson is a 60 y.o. female with remote history of heme positive stool 2015/04/03), history of normocytic anemia, constipation, GERD, prior exposure to hepatitis C from significant other, HCV antibody negative 04/03/15.  EGD and colonoscopy completed April 03, 2015 for anemia, heme positive stool, and weight loss.  EGD revealed mild erosive reflux esophagitis, otherwise normal.  Colonoscopy with diverticulosis, two 5-7 mm polyps.  Pathology with tubular adenoma.  Repeat in 5 years.  She is presenting today to schedule surveillance colonoscopy.  Last seen in our office prior to procedures in 04/03/2015.  Today: Bowels moving well every other day. No brbpr or melena. Has been trying to lose weight through diet and walking over the last 4-5 years. No abdominal pain. No nausea, vomiting, dysphagia. GERD well controlled on omeprazole 40 mg daily.   Her husband passed away in April 02, 2017.  She is doing well.  She stays busy with her dog and her stepson visits frequently.  She also has a son locally in [place].  He drives a tractor trailer, so she does not see him as frequently.  Reports her husband was treated for hepatitis C prior to her being tested in 04/03/2015, so she had no exposure thereafter.  Past Medical History:  Diagnosis Date   Anxiety    Bipolar 1 disorder ([place])    Cardiomyopathy, nonischemic ([place]) April 02, 2013   Cardiomyopathy nonischemic possible Takasubo syndrome   Chronic back pain    Depression    Fibromyalgia    GERD (gastroesophageal reflux disease)    Hypercholesteremia    Hypertension    Impaired fasting glucose    Melanoma ([place]) 12/2008   Right leg   Myocardial infarction [facility]) 09/2013   Osteoarthritis 2006-04-02   left knee   Seizures ([place]) 09/2006   last seizure was 2 years ago, unknown etiology for seizures, possible taking  her off "my xanax".    Past Surgical History:  Procedure Laterality Date   ABDOMINAL HYSTERECTOMY  12/20/2004   COLONOSCOPY  08/20/2010   Dr. Gala Romney: Single hemorrhoidal tag, otherwise normal.   COLONOSCOPY  02-Apr-2004?   C.diff per medical reports   COLONOSCOPY WITH PROPOFOL N/A 02/27/2015   Surgeon: Daneil Dolin, MD; diverticulosis, two 5-7 mm polyps.  Pathology with tubular adenoma.  Repeat in 5 years.   ESOPHAGOGASTRODUODENOSCOPY  02-Apr-2004?   erosive reflux esophagitis per medical history   ESOPHAGOGASTRODUODENOSCOPY (EGD) WITH PROPOFOL N/A 02/27/2015   Surgeon: Daneil Dolin, MD; mild erosive reflux esophagitis, otherwise normal   LEFT AND RIGHT HEART CATHETERIZATION WITH CORONARY ANGIOGRAM N/A 10/18/2013   Procedure: LEFT AND RIGHT HEART CATHETERIZATION WITH CORONARY ANGIOGRAM;  Surgeon: Sinclair Grooms, MD;  Location: [facility] CATH LAB;  Service: Cardiovascular;  Laterality: N/A;   melanoma surgery     POLYPECTOMY  02/27/2015   Procedure: POLYPECTOMY;  Surgeon: Daneil Dolin, MD;  Location: [facility] ORS;  Service: Endoscopy;;    Current Outpatient Medications  Medication Sig Dispense Refill   atorvastatin (LIPITOR) 40 MG tablet Take 1 tablet (40 mg total) by mouth daily. 90 tablet 1   DULoxetine (CYMBALTA) 60 MG capsule Take 1 capsule (60 mg total) by mouth daily. 90 capsule 1   levETIRAcetam (KEPPRA) 500 MG tablet Take 1 tablet (500 mg total) by mouth 2 (two) times daily. [place]  tablet 1   lisinopril (ZESTRIL) 10 MG tablet Take 1 tablet (10 mg total) by mouth daily. 90 tablet 1   metoprolol succinate (TOPROL-XL) 25 MG 24 hr tablet Take 1 tablet (25 mg total) by mouth daily. 90 tablet 1   omeprazole (PRILOSEC) 40 MG capsule TAKE 1 CAPSULE BY MOUTH  DAILY 90 capsule 1   No current facility-administered medications for this visit.    Allergies as of 07/09/2021 - Review Complete 07/09/2021  Allergen Reaction Noted   Klonopin [clonazepam]  02/04/2016    Family History  Problem Relation Age of  Onset   Hypertension Mother    Anxiety disorder Mother    Hypertension Father    Alcohol abuse Paternal Aunt    Alcohol abuse Paternal Uncle    Drug abuse Brother    Colon cancer Neg Hx     Social History   Socioeconomic History   Marital status: Single    Spouse name: Not on file   Number of children: Not on file   Years of education: Not on file   Highest education level: Not on file  Occupational History   Not on file  Tobacco Use   Smoking status: Former    Packs/day: 2.00    Years: 15.00    Pack years: 30.00    Types: Cigarettes    Start date: 08/28/1973    Quit date: 10/18/1997    Years since quitting: 23.7   Smokeless tobacco: Never   Tobacco comments:    Quit smoking x 15 years, 05-31-2016 per pt no.  Substance and Sexual Activity   Alcohol use: Yes    Comment: very little   Drug use: Yes    Types: Marijuana    Comment: for her nerves   Sexual activity: Yes    Birth control/protection: None  Other Topics Concern   Not on file  Social History Narrative   Not on file   Social Determinants of Health   Financial Resource Strain: Not on file  Food Insecurity: Not on file  Transportation Needs: Not on file  Physical Activity: Not on file  Stress: Not on file  Social Connections: Not on file  Intimate Partner Violence: Not on file    Review of Systems: Gen: Denies any fever, chills, cold or flulike symptoms, presyncope, syncope. CV: Denies chest pain or heart palpitations. Resp: Denies shortness of breath or cough. GI: See HPI GU : Denies urinary burning, urinary frequency, urinary hesitancy MS: Denies joint pain Derm: Denies rash Psych: Admits to anxiety.  Heme: See HPI  Physical Exam: BP (!) 148/95   Pulse 74   Temp 97.7 F (36.5 C) (Temporal)   Ht 5\' 9"  (1.753 m)   Wt 165 lb 9.6 oz (75.1 kg)   BMI 24.45 kg/m  General:   Alert and oriented. Pleasant and cooperative. Well-nourished and well-developed.  Head:  Normocephalic and  atraumatic. Eyes:  Without icterus, sclera clear and conjunctiva pink.  Ears:  Normal auditory acuity. Lungs:  Clear to auscultation bilaterally. No wheezes, rales, or rhonchi. No distress.  Heart:  S1, S2 present without murmurs appreciated.  Abdomen:  +BS, soft, non-tender and non-distended. No HSM noted. No guarding or rebound. No masses appreciated.  Rectal:  Deferred  Msk:  Symmetrical without gross deformities. Normal posture. Pulses:  Normal pulses noted. Extremities:  Without edema. Neurologic:  Alert and  oriented x4;  grossly normal neurologically. Skin:  Intact without significant lesions or rashes. Psych:  Alert and cooperative. Normal mood  and affect.    Assessment: 60 year old female presenting today to schedule surveillance colonoscopy.  Last colonoscopy in March 2016 with 2 tubular adenomas, recommended repeat in 5 years.  She is doing very well today with no significant GI symptoms.  Chronic history of GERD that is well controlled on omeprazole 40 mg daily.  Denies BRBPR, melena, or unintentional weight loss.  She has been intentionally losing weight over the last 4 to 5 years with dietary changes and walking.  Of note, she does have prior exposure to hepatitis C from her significant other.  HCV antibody was negative in 2016.  Patient reports her husband was treated and cured in 2016 prior to her testing.    Plan: 1.  Proceed with colonoscopy with propofol with Dr. Gala Romney in the near future. The risks, benefits, and alternatives have been discussed with the patient in detail. The patient states understanding and desires to proceed.  2.  Follow-up as needed.    Tara Altes, PA-C [facility] 07/09/2021

## 2021-07-09 ENCOUNTER — Encounter: Payer: Self-pay | Admitting: Gastroenterology

## 2021-07-09 ENCOUNTER — Ambulatory Visit (INDEPENDENT_AMBULATORY_CARE_PROVIDER_SITE_OTHER): Payer: Medicare Other | Admitting: Gastroenterology

## 2021-07-09 ENCOUNTER — Telehealth: Payer: Self-pay

## 2021-07-09 ENCOUNTER — Other Ambulatory Visit: Payer: Self-pay

## 2021-07-09 VITALS — BP 148/95 | HR 74 | Temp 97.7°F | Ht 69.0 in | Wt 165.6 lb

## 2021-07-09 DIAGNOSIS — Z86010 Personal history of colonic polyps: Secondary | ICD-10-CM

## 2021-07-09 DIAGNOSIS — Z860101 Personal history of adenomatous and serrated colon polyps: Secondary | ICD-10-CM

## 2021-07-09 MED ORDER — CLENPIQ 10-3.5-12 MG-GM -GM/160ML PO SOLN: 1.0000 | Freq: Once | ORAL | 0 refills | Status: AC

## 2021-07-09 NOTE — Patient Instructions (Signed)
We will arrange for you to have a colonoscopy in the near future with Dr. Gala Romney.  We will follow-up with you in the office as needed.  Do not hesitate to call if they have any new GI concerns.  Hope you have a great summer! Enjoy that little dog!   Aliene Altes, PA-C [facility]

## 2021-07-09 NOTE — Telephone Encounter (Signed)
PA for TCS submitted via [facility] website. PA# [id number], valid 08/13/21-11/11/21.

## 2021-08-04 NOTE — Patient Instructions (Signed)
Tara Dickson  08/04/2021     '@PREFPERIOPPHARMACY'$ @   Your procedure is scheduled on  08/13/2021.   Report to [facility] at  1030 A.M.   Call this number if you have problems the morning of surgery:  [phone]   Remember:  Follow the diet and prep instructions given to you by the office.    Take these medicines the morning of surgery with A SIP OF WATER                  cymbalta, keppra, metoprolol, prilosec.     Do not wear jewelry, make-up or nail polish.  Do not wear lotions, powders, or perfumes, or deodorant.  Do not shave 48 hours prior to surgery.  Men may shave face and neck.  Do not bring valuables to the hospital.  [facility] is not responsible for any belongings or valuables.  Contacts, dentures or bridgework may not be worn into surgery.  Leave your suitcase in the car.  After surgery it may be brought to your room.  For patients admitted to the hospital, discharge time will be determined by your treatment team.  Patients discharged the day of surgery will not be allowed to drive home and must have someone with them for 24 hours.    Special instructions:   DO NOT smoke tobacco or vape for 24 hours.  Please read over the following fact sheets that you were given. Anesthesia Post-op Instructions and Care and Recovery After Surgery      Colonoscopy, Adult, Care After This sheet gives you information about how to care for yourself after your procedure. Your health care provider may also give you more specific instructions. If you have problems or questions, contact your health careprovider. What can I expect after the procedure? After the procedure, it is common to have: A small amount of blood in your stool for 24 hours after the procedure. Some gas. Mild cramping or bloating of your abdomen. Follow these instructions at home: Eating and drinking  Drink enough fluid to keep your urine pale yellow. Follow instructions from your health care  provider about eating or drinking restrictions. Resume your normal diet as instructed by your health care provider. Avoid heavy or fried foods that are hard to digest.  Activity Rest as told by your health care provider. Avoid sitting for a long time without moving. Get up to take short walks every 1-2 hours. This is important to improve blood flow and breathing. Ask for help if you feel weak or unsteady. Return to your normal activities as told by your health care provider. Ask your health care provider what activities are safe for you. Managing cramping and bloating  Try walking around when you have cramps or feel bloated. Apply heat to your abdomen as told by your health care provider. Use the heat source that your health care provider recommends, such as a moist heat pack or a heating pad. Place a towel between your skin and the heat source. Leave the heat on for 20-30 minutes. Remove the heat if your skin turns bright red. This is especially important if you are unable to feel pain, heat, or cold. You may have a greater risk of getting burned.  General instructions If you were given a sedative during the procedure, it can affect you for several hours. Do not drive or operate machinery until your health care provider says that it is safe. For the first 24  hours after the procedure: Do not sign important documents. Do not drink alcohol. Do your regular daily activities at a slower pace than normal. Eat soft foods that are easy to digest. Take over-the-counter and prescription medicines only as told by your health care provider. Keep all follow-up visits as told by your health care provider. This is important. Contact a health care provider if: You have blood in your stool 2-3 days after the procedure. Get help right away if you have: More than a small spotting of blood in your stool. Large blood clots in your stool. Swelling of your abdomen. Nausea or vomiting. A fever. Increasing  pain in your abdomen that is not relieved with medicine. Summary After the procedure, it is common to have a small amount of blood in your stool. You may also have mild cramping and bloating of your abdomen. If you were given a sedative during the procedure, it can affect you for several hours. Do not drive or operate machinery until your health care provider says that it is safe. Get help right away if you have a lot of blood in your stool, nausea or vomiting, a fever, or increased pain in your abdomen. This information is not intended to replace advice given to you by your health care provider. Make sure you discuss any questions you have with your healthcare provider. Document Revised: 11/30/2019 Document Reviewed: 07/02/2019 Elsevier Patient Education  [place] After This sheet gives you information about how to care for yourself after your procedure. Your health care provider may also give you more specific instructions. If you have problems or questions, contact your health careprovider. What can I expect after the procedure? After the procedure, it is common to have: Tiredness. Forgetfulness about what happened after the procedure. Impaired judgment for important decisions. Nausea or vomiting. Some difficulty with balance. Follow these instructions at home: For the time period you were told by your health care provider:     Rest as needed. Do not participate in activities where you could fall or become injured. Do not drive or use machinery. Do not drink alcohol. Do not take sleeping pills or medicines that cause drowsiness. Do not make important decisions or sign legal documents. Do not take care of children on your own. Eating and drinking Follow the diet that is recommended by your health care provider. Drink enough fluid to keep your urine pale yellow. If you vomit: Drink water, juice, or soup when you can drink without  vomiting. Make sure you have little or no nausea before eating solid foods. General instructions Have a responsible adult stay with you for the time you are told. It is important to have someone help care for you until you are awake and alert. Take over-the-counter and prescription medicines only as told by your health care provider. If you have sleep apnea, surgery and certain medicines can increase your risk for breathing problems. Follow instructions from your health care provider about wearing your sleep device: Anytime you are sleeping, including during daytime naps. While taking prescription pain medicines, sleeping medicines, or medicines that make you drowsy. Avoid smoking. Keep all follow-up visits as told by your health care provider. This is important. Contact a health care provider if: You keep feeling nauseous or you keep vomiting. You feel light-headed. You are still sleepy or having trouble with balance after 24 hours. You develop a rash. You have a fever. You have redness or swelling around the IV site.  Get help right away if: You have trouble breathing. You have new-onset confusion at home. Summary For several hours after your procedure, you may feel tired. You may also be forgetful and have poor judgment. Have a responsible adult stay with you for the time you are told. It is important to have someone help care for you until you are awake and alert. Rest as told. Do not drive or operate machinery. Do not drink alcohol or take sleeping pills. Get help right away if you have trouble breathing, or if you suddenly become confused. This information is not intended to replace advice given to you by your health care provider. Make sure you discuss any questions you have with your healthcare provider. Document Revised: 08/21/2020 Document Reviewed: 11/08/2019 Elsevier Patient Education  2022 [employer].

## 2021-08-07 ENCOUNTER — Other Ambulatory Visit: Payer: Self-pay

## 2021-08-07 ENCOUNTER — Encounter (HOSPITAL_COMMUNITY)
Admission: RE | Admit: 2021-08-07 | Discharge: 2021-08-07 | Disposition: A | Discharge status: Home / Self Care | Payer: Medicare Other | Source: Ambulatory Visit | Attending: Internal Medicine | Admitting: Internal Medicine

## 2021-08-07 ENCOUNTER — Encounter (HOSPITAL_COMMUNITY): Payer: Self-pay

## 2021-08-07 DIAGNOSIS — Z01810 Encounter for preprocedural cardiovascular examination: Secondary | ICD-10-CM | POA: Diagnosis not present

## 2021-08-07 HISTORY — DX: Headache, unspecified: R51.9

## 2021-08-13 ENCOUNTER — Encounter (HOSPITAL_COMMUNITY)
Admission: RE | Disposition: A | Discharge status: Home / Self Care | Payer: Self-pay | Source: Home / Self Care | Attending: Internal Medicine

## 2021-08-13 ENCOUNTER — Ambulatory Visit (HOSPITAL_COMMUNITY): Payer: Medicare Other | Admitting: Anesthesiology

## 2021-08-13 ENCOUNTER — Ambulatory Visit (HOSPITAL_COMMUNITY)
Admission: RE | Admit: 2021-08-13 | Discharge: 2021-08-13 | Disposition: A | Discharge status: Home / Self Care | Payer: Medicare Other | Attending: Internal Medicine | Admitting: Internal Medicine

## 2021-08-13 ENCOUNTER — Encounter (HOSPITAL_COMMUNITY): Payer: Self-pay | Admitting: Internal Medicine

## 2021-08-13 DIAGNOSIS — K635 Polyp of colon: Secondary | ICD-10-CM

## 2021-08-13 DIAGNOSIS — Z8249 Family history of ischemic heart disease and other diseases of the circulatory system: Secondary | ICD-10-CM | POA: Diagnosis not present

## 2021-08-13 DIAGNOSIS — Z86010 Personal history of colonic polyps: Secondary | ICD-10-CM

## 2021-08-13 DIAGNOSIS — K219 Gastro-esophageal reflux disease without esophagitis: Secondary | ICD-10-CM | POA: Diagnosis not present

## 2021-08-13 DIAGNOSIS — D124 Benign neoplasm of descending colon: Secondary | ICD-10-CM | POA: Diagnosis not present

## 2021-08-13 DIAGNOSIS — Z888 Allergy status to other drugs, medicaments and biological substances status: Secondary | ICD-10-CM | POA: Diagnosis not present

## 2021-08-13 DIAGNOSIS — Z87891 Personal history of nicotine dependence: Secondary | ICD-10-CM | POA: Diagnosis not present

## 2021-08-13 DIAGNOSIS — Z79899 Other long term (current) drug therapy: Secondary | ICD-10-CM | POA: Insufficient documentation

## 2021-08-13 DIAGNOSIS — I509 Heart failure, unspecified: Secondary | ICD-10-CM | POA: Diagnosis not present

## 2021-08-13 DIAGNOSIS — I110 Hypertensive heart disease with heart failure: Secondary | ICD-10-CM | POA: Diagnosis not present

## 2021-08-13 DIAGNOSIS — Z1211 Encounter for screening for malignant neoplasm of colon: Secondary | ICD-10-CM | POA: Insufficient documentation

## 2021-08-13 DIAGNOSIS — D123 Benign neoplasm of transverse colon: Secondary | ICD-10-CM | POA: Insufficient documentation

## 2021-08-13 HISTORY — PX: COLONOSCOPY WITH PROPOFOL: SHX5780

## 2021-08-13 HISTORY — PX: POLYPECTOMY: SHX5525

## 2021-08-13 SURGERY — COLONOSCOPY WITH PROPOFOL
Anesthesia: General

## 2021-08-13 MED ORDER — LACTATED RINGERS IV SOLN: INTRAVENOUS | Status: DC

## 2021-08-13 MED ORDER — STERILE WATER FOR IRRIGATION IR SOLN
Status: DC | PRN
  Administered 2021-08-13: 100 mL

## 2021-08-13 MED ORDER — LIDOCAINE HCL 1 % IJ SOLN
INTRAMUSCULAR | Status: DC | PRN
  Administered 2021-08-13: 50 mg via INTRADERMAL

## 2021-08-13 MED ORDER — PROPOFOL 500 MG/50ML IV EMUL
INTRAVENOUS | Status: DC | PRN
  Administered 2021-08-13: 150 ug/kg/min via INTRAVENOUS

## 2021-08-13 MED ORDER — PROPOFOL 10 MG/ML IV BOLUS
INTRAVENOUS | Status: DC | PRN
  Administered 2021-08-13: 20 mg via INTRAVENOUS
  Administered 2021-08-13: 100 mg via INTRAVENOUS
  Administered 2021-08-13: 20 mg via INTRAVENOUS

## 2021-08-13 NOTE — Discharge Instructions (Addendum)
  Colonoscopy Discharge Instructions  Read the instructions outlined below and refer to this sheet in the next few weeks. These discharge instructions provide you with general information on caring for yourself after you leave the hospital. Your doctor may also give you specific instructions. While your treatment has been planned according to the most current medical practices available, unavoidable complications occasionally occur. If you have any problems or questions after discharge, call Dr. Gala Romney at [phone]. ACTIVITY You may resume your regular activity, but move at a slower pace for the next 24 hours.  Take frequent rest periods for the next 24 hours.  Walking will help get rid of the air and reduce the bloated feeling in your belly (abdomen).  No driving for 24 hours (because of the medicine (anesthesia) used during the test).   Do not sign any important legal documents or operate any machinery for 24 hours (because of the anesthesia used during the test).  NUTRITION Drink plenty of fluids.  You may resume your normal diet as instructed by your doctor.  Begin with a light meal and progress to your normal diet. Heavy or fried foods are harder to digest and may make you feel sick to your stomach (nauseated).  Avoid alcoholic beverages for 24 hours or as instructed.  MEDICATIONS You may resume your normal medications unless your doctor tells you otherwise.  WHAT YOU CAN EXPECT TODAY Some feelings of bloating in the abdomen.  Passage of more gas than usual.  Spotting of blood in your stool or on the toilet paper.  IF YOU HAD POLYPS REMOVED DURING THE COLONOSCOPY: No aspirin products for 7 days or as instructed.  No alcohol for 7 days or as instructed.  Eat a soft diet for the next 24 hours.  FINDING OUT THE RESULTS OF YOUR TEST Not all test results are available during your visit. If your test results are not back during the visit, make an appointment with your caregiver to find out the  results. Do not assume everything is normal if you have not heard from your caregiver or the medical facility. It is important for you to follow up on all of your test results.  SEEK IMMEDIATE MEDICAL ATTENTION IF: You have more than a spotting of blood in your stool.  Your belly is swollen (abdominal distention).  You are nauseated or vomiting.  You have a temperature over 101.  You have abdominal pain or discomfort that is severe or gets worse throughout the day.    2 polyps removed in your colon today  Further recommendations to follow pending review of pathology report  At patient request, I called Heloise Beecham at [phone] -reviewed results and recommendations

## 2021-08-13 NOTE — Anesthesia Postprocedure Evaluation (Signed)
Anesthesia Post Note  Patient: Tara Dickson  Procedure(s) Performed: COLONOSCOPY WITH PROPOFOL POLYPECTOMY  Patient location during evaluation: PACU Anesthesia Type: General Level of consciousness: awake and alert Pain management: pain level controlled Vital Signs Assessment: post-procedure vital signs reviewed and stable Respiratory status: spontaneous breathing Cardiovascular status: blood pressure returned to baseline and stable Postop Assessment: no apparent nausea or vomiting Anesthetic complications: no   No notable events documented.   Last Vitals:  Vitals:   08/13/21 1019 08/13/21 1334  BP: (!) 160/87 107/64  Pulse: (!) 53 (!) 56  Resp: 15 17  Temp: 37.1 C 36.7 C  SpO2: 97% 99%    Last Pain:  Vitals:   08/13/21 1334  TempSrc: Oral  PainSc:                  [name]

## 2021-08-13 NOTE — H&P (Signed)
$'@LOGO'j$ @   Primary Care Physician:  Kathyrn Drown, MD Primary Gastroenterologist:  Dr. Gala Romney  Pre-Procedure History & Physical: HPI:  Tara Dickson is a 60 y.o. female here for surveillance colonoscopy.  History multiple colonic adenomas removed previously  Past Medical History:  Diagnosis Date   Anxiety    Bipolar 1 disorder ([place])    Cardiomyopathy, nonischemic ([place]) 12/20/2012   Cardiomyopathy nonischemic possible Takasubo syndrome   Chronic back pain    Depression    Fibromyalgia    GERD (gastroesophageal reflux disease)    Headache    Hypercholesteremia    Hypertension    Impaired fasting glucose    Melanoma ([place]) 12/20/2008   Right leg   Myocardial infarction ([place]) 09/19/2013   Osteoarthritis 12/20/2005   left knee   Seizures ([place]) 09/19/2013   unknown etiology for seizures, possible taking her off "my xanax".    Past Surgical History:  Procedure Laterality Date   ABDOMINAL HYSTERECTOMY  12/20/2004   COLONOSCOPY  08/20/2010   Dr. Gala Romney: Single hemorrhoidal tag, otherwise normal.   COLONOSCOPY  2005?   C.diff per medical reports   COLONOSCOPY WITH PROPOFOL N/A 02/27/2015   Surgeon: Daneil Dolin, MD; diverticulosis, two 5-7 mm polyps.  Pathology with tubular adenoma.  Repeat in 5 years.   ESOPHAGOGASTRODUODENOSCOPY  2005?   erosive reflux esophagitis per medical history   ESOPHAGOGASTRODUODENOSCOPY (EGD) WITH PROPOFOL N/A 02/27/2015   Surgeon: Daneil Dolin, MD; mild erosive reflux esophagitis, otherwise normal   LEFT AND RIGHT HEART CATHETERIZATION WITH CORONARY ANGIOGRAM N/A 10/18/2013   Procedure: LEFT AND RIGHT HEART CATHETERIZATION WITH CORONARY ANGIOGRAM;  Surgeon: Sinclair Grooms, MD;  Location[facility] CATH LAB;  Service: Cardiovascular;  Laterality: N/A;   melanoma surgery     POLYPECTOMY  02/27/2015   Procedure: POLYPECTOMY;  Surgeon: Daneil Dolin, MD;  Location: [facility] ORS;  Service: Endoscopy;;    Prior to Admission medications   Medication Sig  Start Date End Date Taking? Authorizing Provider  atorvastatin (LIPITOR) 40 MG tablet Take 1 tablet (40 mg total) by mouth daily. 05/01/21  Yes Luking, Elayne Snare, MD  DULoxetine (CYMBALTA) 60 MG capsule Take 1 capsule (60 mg total) by mouth daily. 05/01/21  Yes Kathyrn Drown, MD  levETIRAcetam (KEPPRA) 500 MG tablet Take 1 tablet (500 mg total) by mouth 2 (two) times daily. 05/01/21  Yes Luking, Elayne Snare, MD  lisinopril (ZESTRIL) 10 MG tablet Take 1 tablet (10 mg total) by mouth daily. 05/01/21  Yes Kathyrn Drown, MD  metoprolol succinate (TOPROL-XL) 25 MG 24 hr tablet Take 1 tablet (25 mg total) by mouth daily. 05/01/21  Yes Luking, Elayne Snare, MD  omeprazole (PRILOSEC) 40 MG capsule TAKE 1 CAPSULE BY MOUTH  DAILY Patient taking differently: Take 40 mg by mouth daily. 07/07/21  Yes Luking, Scott A, MD  Lysine 500 MG TABS Take 500 mg by mouth daily as needed (outbreak).    [provider]    Allergies as of 07/09/2021 - Review Complete 07/09/2021  Allergen Reaction Noted   Klonopin [clonazepam]  02/04/2016    Family History  Problem Relation Age of Onset   Hypertension Mother    Anxiety disorder Mother    Hypertension Father    Alcohol abuse Paternal Aunt    Alcohol abuse Paternal Uncle    Drug abuse Brother    Colon cancer Neg Hx     Social History   Socioeconomic History   Marital status: Single  Spouse name: Not on file   Number of children: Not on file   Years of education: Not on file   Highest education level: Not on file  Occupational History   Not on file  Tobacco Use   Smoking status: Former    Packs/day: 2.00    Years: 15.00    Pack years: 30.00    Types: Cigarettes    Start date: 08/28/1973    Quit date: 10/18/1997    Years since quitting: 23.8   Smokeless tobacco: Never   Tobacco comments:    Quit smoking x 15 years, 05-31-2016 per pt no.  Vaping Use   Vaping Use: Never used  Substance and Sexual Activity   Alcohol use: Yes    Comment: very little    Drug use: Yes    Types: Marijuana    Comment: for her nerves, about 4 grams in a bowl   Sexual activity: Yes    Birth control/protection: None  Other Topics Concern   Not on file  Social History Narrative   Not on file   Social Determinants of Health   Financial Resource Strain: Not on file  Food Insecurity: Not on file  Transportation Needs: Not on file  Physical Activity: Not on file  Stress: Not on file  Social Connections: Not on file  Intimate Partner Violence: Not on file    Review of Systems: See HPI, otherwise negative ROS  Physical Exam: BP (!) 160/87   Pulse (!) 53   Temp 98.7 F (37.1 C) (Oral)   Resp 15   Ht 5' 7.5" (1.715 m)   Wt 74.8 kg   SpO2 97%   BMI 25.46 kg/m  General:   Alert,  Well-developed, well-nourished, pleasant and cooperative in NAD Mouth:  No deformity or lesions. Neck:  Supple; no masses or thyromegaly. No significant cervical adenopathy. Lungs:  Clear throughout to auscultation.   No wheezes, crackles, or rhonchi. No acute distress. Heart:  Regular rate and rhythm; no murmurs, clicks, rubs,  or gallops. Abdomen: Non-distended, normal bowel sounds.  Soft and nontender without appreciable mass or hepatosplenomegaly.   Impression/Plan: Pleasant 60 year old lady here for surveillance colonoscopy. The risks, benefits, limitations, alternatives and imponderables have been reviewed with the patient. Questions have been answered. All parties are agreeable.      Notice: This dictation was prepared with Dragon dictation along with smaller phrase technology. Any transcriptional errors that result from this process are unintentional and may not be corrected upon review.

## 2021-08-13 NOTE — Anesthesia Preprocedure Evaluation (Signed)
Anesthesia Evaluation  Patient identified by MRN, date of birth, ID band Patient awake    Reviewed: Allergy & Precautions, NPO status , Patient's Chart, lab work & pertinent test results, reviewed documented beta blocker date and time   History of Anesthesia Complications Negative for: history of anesthetic complications  Airway Mallampati: II  TM Distance: >3 FB Neck ROM: Full    Dental  (+) Dental Advisory Given, Chipped, Missing, Poor Dentition   Pulmonary Patient abstained from smoking., former smoker,    Pulmonary exam normal breath sounds clear to auscultation       Cardiovascular Exercise Tolerance: Good hypertension, Pt. on medications and Pt. on home beta blockers + Past MI and +CHF   Rhythm:Regular Rate:Normal + Systolic murmurs [id number] 13:08:17 [facility] ROUTINE RECORD 1961/06/20 (82 yr) Female Caucasian Vent. rate 48 BPM PR interval 168 ms QRS duration 90 ms QT/QTcB 478/427 ms P-R-T axes 55 57 27 Sinus bradycardia Nonspecific ST abnormality Abnormal ECG Confirmed by Asencion Noble [phone]) on 08/08/2021 6:45:48 AM   Neuro/Psych  Headaches, Seizures -, Well Controlled,  PSYCHIATRIC DISORDERS Anxiety Depression Bipolar Disorder  Neuromuscular disease    GI/Hepatic GERD  Medicated,(+)     substance abuse  marijuana use,   Endo/Other  negative endocrine ROS  Renal/GU negative Renal ROS     Musculoskeletal  (+) Arthritis , Fibromyalgia -  Abdominal   Peds  Hematology  (+) anemia ,   Anesthesia Other Findings   Reproductive/Obstetrics                             Anesthesia Physical Anesthesia Plan  ASA: 3  Anesthesia Plan: General   Post-op Pain Management:    Induction: Intravenous  PONV Risk Score and Plan: Propofol infusion  Airway Management Planned: Nasal Cannula and Natural Airway  Additional Equipment:   Intra-op Plan:    Post-operative Plan:   Informed Consent: I have reviewed the patients History and Physical, chart, labs and discussed the procedure including the risks, benefits and alternatives for the proposed anesthesia with the patient or authorized representative who has indicated his/her understanding and acceptance.     Dental advisory given  Plan Discussed with: CRNA and Surgeon  Anesthesia Plan Comments:         Anesthesia Quick Evaluation

## 2021-08-13 NOTE — Transfer of Care (Signed)
Immediate Anesthesia Transfer of Care Note  Patient: Tara Dickson  Procedure(s) Performed: COLONOSCOPY WITH PROPOFOL POLYPECTOMY  Patient Location: Short Stay  Anesthesia Type:General  Level of Consciousness: awake  Airway & Oxygen Therapy: Patient Spontanous Breathing  Post-op Assessment: Report given to RN  Post vital signs: Reviewed  Last Vitals:  Vitals Value Taken Time  BP    Temp    Pulse    Resp    SpO2      Last Pain:  Vitals:   08/13/21 1306  TempSrc:   PainSc: 0-No pain         Complications: No notable events documented.

## 2021-08-13 NOTE — Op Note (Signed)
[facility] Patient Name: Tara Dickson Procedure Date: 08/13/2021 12:15 PM MRN: [id number] Date of Birth: [birth year] Attending MD: Norvel Richards , MD CSN: [id number] Age: 60 Admit Type: Outpatient Procedure:                Colonoscopy Indications:              High risk colon cancer surveillance: Personal                            history of colonic polyps Providers:                Norvel Richards, MD, Caprice Kluver, Raphael Gibney, Technician Referring MD:              Medicines:                Propofol per Anesthesia Complications:            No immediate complications. Estimated Blood Loss:     Estimated blood loss: none. Procedure:                After obtaining informed consent, the colonoscope                            was passed under direct vision. Throughout the                            procedure, the patient's blood pressure, pulse, and                            oxygen saturations were monitored continuously. The                            [id number]) scope was introduced through                            the anus and advanced to the the cecum, identified                            by appendiceal orifice and ileocecal valve. Scope In: 1:12:06 PM Scope Out: 1:26:07 PM Scope Withdrawal Time: 0 hours 7 minutes 30 seconds  Total Procedure Duration: 0 hours 14 minutes 1 second  Findings:      Two semi-pedunculated polyps were found in the descending colon and       hepatic flexure. The polyps were 5 to 6 mm in size.      The exam was otherwise without abnormality on direct and retroflexion       views. Impression:               - Two 5 to 6 mm polyps in the descending colon and                            at the hepatic flexure.                           -  The examination was otherwise normal on direct                            and retroflexion views.                           - No specimens collected. Moderate  Sedation:      Moderate (conscious) sedation was personally administered by an       anesthesia professional. The following parameters were monitored: oxygen       saturation, heart rate, blood pressure, respiratory rate, EKG, adequacy       of pulmonary ventilation, and response to care. Recommendation:           - Patient has a contact number available for                            emergencies. The signs and symptoms of potential                            delayed complications were discussed with the                            patient. Return to normal activities tomorrow.                            Written discharge instructions were provided to the                            patient.                           - Resume previous diet.                           - Continue present medications.                           - Await pathology results.                           - Repeat colonoscopy after studies are complete for                            surveillance.                           - Return to GI office (date not yet determined). Procedure Code(s):        --- Professional ---                           [phone], Colonoscopy, flexible; diagnostic, including                            collection of specimen(s) by brushing or washing,                            when performed (separate  procedure) Diagnosis Code(s):        --- Professional ---                           Z86.010, Personal history of colonic polyps                           K63.5, Polyp of colon CPT copyright 2019 American Medical Association. All rights reserved. The codes documented in this report are preliminary and upon coder review may  be revised to meet current compliance requirements. Tara Dickson. Tara [place], MD Norvel Richards, MD 08/13/2021 1:39:51 PM This report has been signed electronically. Number of Addenda: 0

## 2021-08-14 LAB — SURGICAL PATHOLOGY

## 2021-08-16 ENCOUNTER — Encounter: Payer: Self-pay | Admitting: Internal Medicine

## 2021-08-28 ENCOUNTER — Telehealth: Payer: Self-pay | Admitting: Family Medicine

## 2021-08-28 NOTE — Telephone Encounter (Signed)
LVM for pt to rtn my call to schedule AWV with [facility]. Please schedule this appt if pt calls the office.   Thanks

## 2021-09-01 ENCOUNTER — Encounter (HOSPITAL_COMMUNITY): Payer: Self-pay | Admitting: Internal Medicine

## 2021-09-16 ENCOUNTER — Telehealth: Payer: Self-pay | Admitting: Family Medicine

## 2021-09-16 DIAGNOSIS — I10 Essential (primary) hypertension: Secondary | ICD-10-CM

## 2021-09-16 DIAGNOSIS — Z0000 Encounter for general adult medical examination without abnormal findings: Secondary | ICD-10-CM

## 2021-09-16 DIAGNOSIS — E7849 Other hyperlipidemia: Secondary | ICD-10-CM

## 2021-09-16 DIAGNOSIS — Z79899 Other long term (current) drug therapy: Secondary | ICD-10-CM

## 2021-09-16 DIAGNOSIS — Z114 Encounter for screening for human immunodeficiency virus [HIV]: Secondary | ICD-10-CM

## 2021-09-16 NOTE — Telephone Encounter (Signed)
Patient scheduled a physical for 10/28 and needing labs done

## 2021-09-17 NOTE — Telephone Encounter (Signed)
Lipid, liver, metabolic 7 Wellness, high risk medicine, hyperlipidemia  Also HIV antibody per CDC guideline for screening

## 2021-09-17 NOTE — Telephone Encounter (Signed)
Lab orders placed and pt is aware. Pt verbalized understanding ?

## 2021-10-16 ENCOUNTER — Encounter: Payer: Medicare Other | Admitting: Family Medicine

## 2021-10-30 ENCOUNTER — Telehealth: Payer: Self-pay | Admitting: Family Medicine

## 2021-10-30 NOTE — Telephone Encounter (Signed)
°  Left message for patient to call back and schedule Medicare Annual Wellness Visit (AWV) in office.  ° °If unable to come into the office for AWV,  please offer to do virtually or by telephone. ° °No hx of AWV eligible for AWVI as of 12/20/2009 per [employer]  ° °Please schedule at anytime with [facility]Nurse Health Advisor.     ° °40 Minutes appointment  ° °Any questions, please call me at [phone]   °

## 2021-11-20 DIAGNOSIS — E7849 Other hyperlipidemia: Secondary | ICD-10-CM | POA: Diagnosis not present

## 2021-11-20 DIAGNOSIS — Z0000 Encounter for general adult medical examination without abnormal findings: Secondary | ICD-10-CM | POA: Diagnosis not present

## 2021-11-20 DIAGNOSIS — Z79899 Other long term (current) drug therapy: Secondary | ICD-10-CM | POA: Diagnosis not present

## 2021-11-20 DIAGNOSIS — I10 Essential (primary) hypertension: Secondary | ICD-10-CM | POA: Diagnosis not present

## 2021-11-21 LAB — LIPID PANEL
Chol/HDL Ratio: 4.1 ratio (ref 0.0–4.4)
Cholesterol, Total: 170 mg/dL (ref 100–199)
HDL: 41 mg/dL (ref >39)
LDL Chol Calc (NIH): 102 mg/dL — ABNORMAL HIGH (ref 0–99)
Triglycerides: 154 mg/dL — ABNORMAL HIGH (ref 0–149)
VLDL Cholesterol Cal: 27 mg/dL (ref 5–40)

## 2021-11-21 LAB — BASIC METABOLIC PANEL
BUN/Creatinine Ratio: 7 — ABNORMAL LOW (ref 12–28)
BUN: 7 mg/dL — ABNORMAL LOW (ref 8–27)
CO2: 29 mmol/L (ref 20–29)
Calcium: 9.6 mg/dL (ref 8.7–10.3)
Chloride: 101 mmol/L (ref 96–106)
Creatinine, Ser: 0.99 mg/dL (ref 0.57–1.00)
Glucose: 68 mg/dL — ABNORMAL LOW (ref 70–99)
Potassium: 3.1 mmol/L — ABNORMAL LOW (ref 3.5–5.2)
Sodium: 143 mmol/L (ref 134–144)
eGFR: 65 mL/min/{1.73_m2} (ref >59)

## 2021-11-21 LAB — HEPATIC FUNCTION PANEL
ALT: 16 IU/L (ref 0–32)
AST: 25 IU/L (ref 0–40)
Albumin: 4.4 g/dL (ref 3.8–4.8)
Alkaline Phosphatase: 79 IU/L (ref 44–121)
Bilirubin Total: 0.4 mg/dL (ref 0.0–1.2)
Bilirubin, Direct: 0.11 mg/dL (ref 0.00–0.40)
Total Protein: 7 g/dL (ref 6.0–8.5)

## 2021-11-21 LAB — HIV ANTIBODY (ROUTINE TESTING W REFLEX): HIV Screen 4th Generation wRfx: NONREACTIVE

## 2021-11-23 ENCOUNTER — Other Ambulatory Visit: Payer: Self-pay

## 2021-11-23 DIAGNOSIS — E876 Hypokalemia: Secondary | ICD-10-CM

## 2021-11-23 MED ORDER — POTASSIUM CHLORIDE CRYS ER 10 MEQ PO TBCR: EXTENDED_RELEASE_TABLET | ORAL | 5 refills | Status: DC

## 2021-12-02 ENCOUNTER — Telehealth: Payer: Self-pay | Admitting: *Deleted

## 2021-12-02 NOTE — Chronic Care Management (AMB) (Signed)
Chronic Care Management   Note  12/02/2021 Name: BRIDGID PRINTZ MRN: [id number] DOB: 08-Feb-1961  TAHJA LIAO is a 60 y.o. year old female who is a primary care patient of Luking, Elayne Snare, MD. I reached out to Simeon Craft by phone today in response to a referral sent by Ms. Mechele Claude Kitson's PCP.  Ms. Boateng was given information about Chronic Care Management services today including:  CCM service includes personalized support from designated clinical staff supervised by her physician, including individualized plan of care and coordination with other care providers 24/7 contact phone numbers for assistance for urgent and routine care needs. Service will only be billed when office clinical staff spend 20 minutes or more in a month to coordinate care. Only one practitioner may furnish and bill the service in a calendar month. The patient may stop CCM services at any time (effective at the end of the month) by phone call to the office staff. The patient is responsible for co-pay (up to 20% after annual deductible is met) if co-pay is required by the individual health plan.   Patient agreed to services and verbal consent obtained.   Follow up plan: Telephone appointment with care management team member scheduled for:12/11/21  [place]: [phone]

## 2021-12-08 ENCOUNTER — Other Ambulatory Visit: Payer: Self-pay | Admitting: Family Medicine

## 2021-12-11 ENCOUNTER — Ambulatory Visit (INDEPENDENT_AMBULATORY_CARE_PROVIDER_SITE_OTHER): Payer: Medicare Other | Admitting: Pharmacist

## 2021-12-11 DIAGNOSIS — E7849 Other hyperlipidemia: Secondary | ICD-10-CM

## 2021-12-11 DIAGNOSIS — F332 Major depressive disorder, recurrent severe without psychotic features: Secondary | ICD-10-CM

## 2021-12-11 DIAGNOSIS — I10 Essential (primary) hypertension: Secondary | ICD-10-CM

## 2021-12-11 MED ORDER — EZETIMIBE 10 MG PO TABS: 10.0000 mg | ORAL_TABLET | Freq: Every day | ORAL | 3 refills | Status: DC

## 2021-12-11 NOTE — Patient Instructions (Addendum)
Tara Dickson,  It was great to talk to you today!  Please call me with any questions or concerns.   Visit Information   Following is a copy of your full care plan:  Care Plan : Medication Management  Updates made by Beryle Lathe, [place] since 12/11/2021 12:00 AM     Problem: HTN, HLD, Depression   Priority: High  Onset Date: 12/11/2021     Long-Range Goal: Disease Progression Prevention   Start Date: 12/11/2021  Expected End Date: 03/11/2022  This Visit's Progress: On track  Priority: High  Note:   Current Barriers:  Unable to achieve control of hyperlipidemia and mental health  Pharmacist Clinical Goal(s):  Through collaboration with PharmD and provider, patient will  Achieve control of hyperlipidemia and mental health as evidenced by improved LDL, improved triglycerides, and improved mood   Interventions: 1:1 collaboration with Kathyrn Drown, MD regarding development and update of comprehensive plan of care as evidenced by provider attestation and co-signature Inter-disciplinary care team collaboration (see longitudinal plan of care) Comprehensive medication review performed; medication list updated in electronic medical record  Hypertension - Goal Met.: Blood pressure under good control. Blood pressure is at goal of <130/80 mmHg per 2017 AHA/ACC guidelines. Current medications: lisinopril 10 mg by mouth once daily and metoprolol succinate 25 mg by mouth once daily Intolerances: none Taking medications as directed: yes Side effects thought to be attributed to current medication regimen: no Current exercise: not discussed today Current home blood pressure: not discussed today Continue current medications as above  Hyperlipidemia/History of NSTEMI 2014 - New goal.: Uncontrolled. LDL above goal of <70 due to very high risk given established clinical ASCVD per 2020 AACE/ACE guidelines. Triglycerides above goal of <150 per 2020 AACE/ACE guidelines. Current  medications: atorvastatin 40 mg by mouth once daily Also taking a beta blocker and ACE inhibitor; unsure why not on an antiplatelet  Intolerances: none Taking medications as directed: yes Side effects thought to be attributed to current medication regimen: no Continue atorvastatin 40 mg by mouth once daily Continue beta blocker and ACE inhibitor Discussed with PCP and will add ezetimibe 10 mg by mouth once daily Discussed with PCP and will restart aspirin 81 mg by mouth daily. Patient reports she will get this over the counter. Encourage dietary reduction of high fat containing foods such as butter, nuts, bacon, egg yolks, etc. Reviewed risks of hyperlipidemia, principles of treatment and consequences of untreated hyperlipidemia Discussed need for medication compliance Re-check lipid panel in 4-12 weeks  Depression  - New goal.: Uncontrolled per patient. "Feels like I'm in a rut". This is a difficult time of year per patient.  Current medications: duloxetine 60 mg by mouth daily Continue current medications for now per patient wishes. Patient declines referral to LCSW or other counselor at this time May need to consider adding additional pharmacotherapy. Would avoid bupropion due to potential to lower seizure threshold. Will discuss more at future visits.   Patient Goals/Self-Care Activities Patient will:  Take medications as prescribed Engage in dietary modifications by decreased fat intake  Follow Up Plan: Telephone follow up appointment with care management team member scheduled for: 03/05/22      Consent to CCM Services: [name] was given information about Chronic Care Management services including:  CCM service includes personalized support from designated clinical staff supervised by her physician, including individualized plan of care and coordination with other care providers 24/7 contact phone numbers for assistance for urgent and routine care needs.  Service will only  be billed when office clinical staff spend 20 minutes or more in a month to coordinate care. Only one practitioner may furnish and bill the service in a calendar month. The patient may stop CCM services at any time (effective at the end of the month) by phone call to the office staff. The patient will be responsible for cost sharing (co-pay) of up to 20% of the service fee (after annual deductible is met).  Patient agreed to services and verbal consent obtained.   Plan: Telephone follow up appointment with care management team member scheduled for:  03/05/22  Kennon Holter, PharmD, BCACP, CPP Clinical Pharmacist Practitioner [place] [phone]  Please call the care guide team at [phone] if you need to cancel or reschedule your appointment.   Patient verbalizes understanding of instructions provided today and agrees to view in [place].

## 2021-12-11 NOTE — Chronic Care Management (AMB) (Signed)
Chronic Care Management Pharmacy Note  12/11/2021 Name:  Tara Dickson MRN:  [id number] DOB:  1961/01/02  Summary: Hyperlipidemia/History of NSTEMI 2014 Uncontrolled. LDL above goal of <70 due to very high risk given established clinical ASCVD per 2020 AACE/ACE guidelines. Triglycerides above goal of <150 per 2020 AACE/ACE guidelines. Continue atorvastatin 40 mg by mouth once daily Continue beta blocker and ACE inhibitor Discussed with PCP and will add ezetimibe 10 mg by mouth once daily Discussed with PCP and will restart aspirin 81 mg by mouth daily. Patient reports she will get this over the counter. Re-check lipid panel in 4-12 weeks  Subjective: Tara Dickson is an 60 y.o. year old female who is a primary patient of Luking, Elayne Snare, MD.  The CCM team was consulted for assistance with disease management and care coordination needs.    Engaged with patient by telephone for initial visit in response to provider referral for pharmacy case management and/or care coordination services.   Consent to Services:  The patient was given the following information about Chronic Care Management services today, agreed to services, and gave verbal consent: 1. CCM service includes personalized support from designated clinical staff supervised by the primary care provider, including individualized plan of care and coordination with other care providers 2. 24/7 contact phone numbers for assistance for urgent and routine care needs. 3. Service will only be billed when office clinical staff spend 20 minutes or more in a month to coordinate care. 4. Only one practitioner may furnish and bill the service in a calendar month. 5.The patient may stop CCM services at any time (effective at the end of the month) by phone call to the office staff. 6. The patient will be responsible for cost sharing (co-pay) of up to 20% of the service fee (after annual deductible is met). Patient agreed to services and  consent obtained.  Patient Care Team: Kathyrn Drown, MD as PCP - General (Family Medicine) Herminio Commons, MD (Inactive) as PCP - Cardiology (Cardiology) Daneil Dolin, MD as Consulting Physician (Gastroenterology) Beryle Lathe, [facility] (Pharmacist)  Objective:  Lab Results  Component Value Date   CREATININE 0.99 11/20/2021   CREATININE 0.93 04/24/2021   CREATININE 0.92 09/11/2018    Lab Results  Component Value Date   HGBA1C 5.8 (H) 10/29/2015   Last diabetic Eye exam: No results found for: HMDIABEYEEXA  Last diabetic Foot exam: No results found for: HMDIABFOOTEX      Component Value Date/Time   CHOL 170 11/20/2021 0834   TRIG 154 (H) 11/20/2021 0834   HDL 41 11/20/2021 0834   CHOLHDL 4.1 11/20/2021 0834   CHOLHDL 4.3 05/12/2015 1122   VLDL 31 05/12/2015 1122   LDLCALC 102 (H) 11/20/2021 0834    Hepatic Function Latest Ref Rng & Units 11/20/2021 04/24/2021 09/11/2018  Total Protein 6.0 - 8.5 g/dL 7.0 7.3 7.0  Albumin 3.8 - 4.8 g/dL 4.4 4.2 4.4  AST 0 - 40 IU/L _0 ALT 0 - 32 IU/L _1 Alk Phosphatase 44 - 121 IU/L 79 70 88  Total Bilirubin 0.0 - 1.2 mg/dL 0.4 0.7 0.3  Bilirubin, Direct 0.00 - 0.40 mg/dL 0.11 - 0.10    Lab Results  Component Value Date/Time   TSH 1.360 09/11/2018 09:49 AM   TSH 0.703 11/01/2016 10:00 AM   FREET4 1.02 11/01/2016 10:00 AM    CBC Latest Ref Rng & Units 04/24/2021 09/11/2018 11/01/2016  WBC 3.4 - 10.8  x10E3/uL 6.2 6.5 5.5  Hemoglobin 11.1 - 15.9 g/dL 11.1 12.0 11.4  Hematocrit 34.0 - 46.6 % 33.8(L) 37.3 34.7  Platelets 150 - 450 x10E3/uL 161 174 153    No results found for: VD25OH  Clinical ASCVD: Yes  The ASCVD Risk score (Arnett DK, et al., 2019) failed to calculate for the following reasons:   The patient has a prior MI or stroke diagnosis    Social History   Tobacco Use  Smoking Status Former   Packs/day: 2.00   Years: 15.00   Pack years: 30.00   Types: Cigarettes   Start date: 08/28/1973    Quit date: 10/18/1997   Years since quitting: 24.1  Smokeless Tobacco Never  Tobacco Comments   Quit smoking x 15 years, 05-31-2016 per pt no.   BP Readings from Last 3 Encounters:  08/13/21 107/64  07/09/21 (!) 148/95  05/01/21 127/83   Pulse Readings from Last 3 Encounters:  08/13/21 (!) 58  07/09/21 74  05/01/21 70   Wt Readings from Last 3 Encounters:  08/13/21 165 lb (74.8 kg)  07/09/21 165 lb 9.6 oz (75.1 kg)  05/01/21 167 lb (75.8 kg)    Assessment: Review of patient past medical history, allergies, medications, health status, including review of consultants reports, laboratory and other test data, was performed as part of comprehensive evaluation and provision of chronic care management services.   SDOH:  (Social Determinants of Health) assessments and interventions performed:    CCM Care Plan  Allergies  Allergen Reactions   Klonopin [Clonazepam]     Made her mean    Medications Reviewed Today     Reviewed by Beryle Lathe, [facility] (Pharmacist) on 12/11/21 at 54  Med List Status: <None>   Medication Order Taking? Sig Documenting Provider Last Dose Status Informant  atorvastatin (LIPITOR) 40 MG tablet [id number] Yes TAKE 1 TABLET BY MOUTH  DAILY Luking, Scott A, MD Taking Active   DULoxetine (CYMBALTA) 60 MG capsule [id number] Yes TAKE 1 CAPSULE BY MOUTH  DAILY Kathyrn Drown, MD Taking Active   levETIRAcetam (KEPPRA) 500 MG tablet [id number] Yes TAKE 1 TABLET BY MOUTH  TWICE DAILY Luking, Elayne Snare, MD Taking Active   lisinopril (ZESTRIL) 10 MG tablet [id number] Yes TAKE 1 TABLET BY MOUTH  DAILY Luking, Scott A, MD Taking Active   Lysine 500 MG TABS [id number] Yes Take 500 mg by mouth daily as needed (outbreak). [provider] Taking Active            Med Note Wilmon Pali, MELISSA R   Tue Aug 04, 2021 12:25 PM) Takes when eating tomatoes   metoprolol succinate (TOPROL-XL) 25 MG 24 hr tablet [id number] Yes TAKE 1 TABLET BY MOUTH  DAILY Luking, Elayne Snare, MD  Taking Active   omeprazole (PRILOSEC) 40 MG capsule [id number] Yes TAKE 1 CAPSULE BY MOUTH  DAILY  Patient taking differently: Take 40 mg by mouth daily.   Kathyrn Drown, MD Taking Active Self  potassium chloride Rhetta Mura) 10 MEQ tablet [id number] Yes Take one tablet by mouth on Monday, Wednesday, and Friday Kathyrn Drown, MD Taking Active             Patient Active Problem List   Diagnosis Date Noted   Ingrown right big toenail 12/30/2016   Major depression 05/03/2016   History of melanoma 11/03/2015   Reflux esophagitis    History of colonic polyps    Heme positive stool 02/14/2015   Anorexia nervosa 02/14/2015   Loss  of weight 02/14/2015   Constipation 02/14/2015   Exposure to hepatitis C 02/14/2015   Prediabetes 02/14/2015   Normocytic anemia 12/30/2014   Chronic bilateral low back pain without sciatica 03/29/2014   HTN (hypertension), benign 01/02/2014   Hyperlipidemia 01/02/2014   History of malignant melanoma of skin 01/02/2014   NSTEMI (non-ST elevated myocardial infarction) ([place]) 10/18/2013    Class: Acute   Seizure disorder ([place]) 10/18/2013    Class: Acute   Bipolar disorder ([place]) 10/18/2013    Class: Chronic    Immunization History  Administered Date(s) Administered   Influenza,inj,Quad PF,6+ Mos 10/19/2013, 09/24/2014, 11/03/2017, 11/06/2019, 10/24/2020   Janssen (J&J) SARS-COV-2 Vaccination 08/20/2020   Pneumococcal Polysaccharide-23 02/04/2016    Conditions to be addressed/monitored: HTN, HLD, and Depression  Care Plan : Medication Management  Updates made by Beryle Lathe, [place] since 12/11/2021 12:00 AM     Problem: HTN, HLD, Depression   Priority: High  Onset Date: 12/11/2021     Long-Range Goal: Disease Progression Prevention   Start Date: 12/11/2021  Expected End Date: 03/11/2022  This Visit's Progress: On track  Priority: High  Note:   Current Barriers:  Unable to achieve control of hyperlipidemia and mental  health  Pharmacist Clinical Goal(s):  Through collaboration with PharmD and provider, patient will  Achieve control of hyperlipidemia and mental health as evidenced by improved LDL, improved triglycerides, and improved mood   Interventions: 1:1 collaboration with Kathyrn Drown, MD regarding development and update of comprehensive plan of care as evidenced by provider attestation and co-signature Inter-disciplinary care team collaboration (see longitudinal plan of care) Comprehensive medication review performed; medication list updated in electronic medical record  Hypertension - Goal Met.: Blood pressure under good control. Blood pressure is at goal of <130/80 mmHg per 2017 AHA/ACC guidelines. Current medications: lisinopril 10 mg by mouth once daily and metoprolol succinate 25 mg by mouth once daily Intolerances: none Taking medications as directed: yes Side effects thought to be attributed to current medication regimen: no Current exercise: not discussed today Current home blood pressure: not discussed today Continue current medications as above  Hyperlipidemia/History of NSTEMI 2014 - New goal.: Uncontrolled. LDL above goal of <70 due to very high risk given established clinical ASCVD per 2020 AACE/ACE guidelines. Triglycerides above goal of <150 per 2020 AACE/ACE guidelines. Current medications: atorvastatin 40 mg by mouth once daily Also taking a beta blocker and ACE inhibitor; unsure why not on an antiplatelet  Intolerances: none Taking medications as directed: yes Side effects thought to be attributed to current medication regimen: no Continue atorvastatin 40 mg by mouth once daily Continue beta blocker and ACE inhibitor Discussed with PCP and will add ezetimibe 10 mg by mouth once daily Discussed with PCP and will restart aspirin 81 mg by mouth daily. Patient reports she will get this over the counter. Encourage dietary reduction of high fat containing foods such as butter,  nuts, bacon, egg yolks, etc. Reviewed risks of hyperlipidemia, principles of treatment and consequences of untreated hyperlipidemia Discussed need for medication compliance Re-check lipid panel in 4-12 weeks  Depression  - New goal.: Uncontrolled per patient. "Feels like I'm in a rut". This is a difficult time of year per patient.  Current medications: duloxetine 60 mg by mouth daily Continue current medications for now per patient wishes. Patient declines referral to LCSW or other counselor at this time May need to consider adding additional pharmacotherapy. Would avoid bupropion due to potential to lower seizure threshold. Will discuss more at  future visits.   Patient Goals/Self-Care Activities Patient will:  Take medications as prescribed Engage in dietary modifications by decreased fat intake  Follow Up Plan: Telephone follow up appointment with care management team member scheduled for: 03/05/22      Medication Assistance: None required.  Patient affirms current coverage meets needs.  Patient's preferred pharmacy is:  [facility], [place] [address] [address] Phone: [phone] Fax: [phone]  [facility] ([place], [facility] [address] [place] [address] [place] [address] Phone: [phone] Fax: [phone]  [facility] ([facility] ) - [place], [place] [place] [address] Phone: [phone] Fax: [phone]  Follow Up:  Patient agrees to Care Plan and Follow-up.  Plan: Telephone follow up appointment with care management team member scheduled for:  03/05/22  Kennon Holter, PharmD, BCACP, CPP Clinical Pharmacist Practitioner [place] [phone]

## 2021-12-19 DIAGNOSIS — E7849 Other hyperlipidemia: Secondary | ICD-10-CM

## 2021-12-19 DIAGNOSIS — F332 Major depressive disorder, recurrent severe without psychotic features: Secondary | ICD-10-CM

## 2021-12-19 DIAGNOSIS — I10 Essential (primary) hypertension: Secondary | ICD-10-CM | POA: Diagnosis not present

## 2022-02-08 ENCOUNTER — Other Ambulatory Visit: Payer: Self-pay | Admitting: Family Medicine

## 2022-03-04 ENCOUNTER — Other Ambulatory Visit: Payer: Self-pay | Admitting: Family Medicine

## 2022-03-05 ENCOUNTER — Ambulatory Visit (INDEPENDENT_AMBULATORY_CARE_PROVIDER_SITE_OTHER): Payer: Medicare Other | Admitting: Pharmacist

## 2022-03-05 DIAGNOSIS — E7849 Other hyperlipidemia: Secondary | ICD-10-CM

## 2022-03-05 DIAGNOSIS — F332 Major depressive disorder, recurrent severe without psychotic features: Secondary | ICD-10-CM

## 2022-03-05 DIAGNOSIS — I10 Essential (primary) hypertension: Secondary | ICD-10-CM

## 2022-03-05 NOTE — Patient Instructions (Signed)
Tara Dickson, ? ?It was great to talk to you today! ? ?Please call me with any questions or concerns. ? ?Visit Information ? ?Following are the goals we discussed today:  ? Goals Addressed   ? ?  ?  ?  ?  ? This Visit's Progress  ?  Medication Management     ?  Patient Goals/Self-Care Activities ?Patient will:  ?Take medications as prescribed ?Engage in dietary modifications by decreased fat intake ? ?  ? ?  ?  ? ?Follow-up plan:  Follow-up next week regarding results of lipid panel. Patient plans to schedule appointment with primary care provider soon. ? ?Patient verbalizes understanding of instructions and care plan provided today and agrees to view in [place]. Active MyChart status confirmed with patient.   ? ?Please call the care guide team at [phone] if you need to cancel or reschedule your appointment.  ? ?Kennon Holter, PharmD, BCACP, CPP ?Clinical Pharmacist Practitioner ?[place] ?[phone]  ?

## 2022-03-05 NOTE — Chronic Care Management (AMB) (Signed)
? ? ?Chronic Care Management ?Pharmacy Note ? ?03/05/2022 ?Name:  Tara Dickson MRN:  [id number] DOB:  1961-03-15 ? ?Summary: ?Hyperlipidemia/History of NSTEMI 2014 ?Uncontrolled. LDL above goal of <70 due to very high risk given established clinical ASCVD per 2020 AACE/ACE guidelines. Triglycerides above goal of <150 per 2020 AACE/ACE guidelines. ?Recent changes: ezetimibe added 3 months ago at last visit. Patient reports good adherence to prescribed regimen.  ?Also taking a beta blocker, ACE inhibitor, and aspirin 81 mg daily ?Continue atorvastatin 40 mg by mouth once daily and ezetimibe 10 mg by mouth once daily ?Recheck fasting lipid panel next week ?Continue beta blocker, ACE inhibitor, and aspirin ? ?Depression ?Uncontrolled per patient. "Feels like I'm in a rut". Denies suicidal ideation and thoughts of self harm.  ?Current medications: duloxetine 60 mg by mouth daily ?Continue current medications for now per patient wishes. ?Patient declines referral to LCSW or other counselor at this time ?May need to consider adding additional pharmacotherapy. Would avoid bupropion due to potential to lower seizure threshold. Patient plans to discuss with primary care provider soon. ? ?Subjective: ?Tara Dickson is an 61 y.o. year old female who is a primary patient of Luking, Elayne Snare, MD.  The CCM team was consulted for assistance with disease management and care coordination needs.   ? ?Engaged with patient by telephone for follow up visit in response to provider referral for pharmacy case management and/or care coordination services.  ? ?Consent to Services:  ?The patient was given information about Chronic Care Management services, agreed to services, and gave verbal consent prior to initiation of services.  Please see initial visit note for detailed documentation.  ? ?Patient Care Team: ?Kathyrn Drown, MD as PCP - General (Family Medicine) ?Herminio Commons, MD (Inactive) as PCP - Cardiology  (Cardiology) ?Rourk, Cristopher Estimable, MD as Consulting Physician (Gastroenterology) ?Beryle Lathe, [facility] (Pharmacist) ? ?Objective: ? ?Lab Results  ?Component Value Date  ? CREATININE 0.99 11/20/2021  ? CREATININE 0.93 04/24/2021  ? CREATININE 0.92 09/11/2018  ? ? ?Lab Results  ?Component Value Date  ? HGBA1C 5.8 (H) 10/29/2015  ? ?Last diabetic Eye exam: No results found for: HMDIABEYEEXA  ?Last diabetic Foot exam: No results found for: HMDIABFOOTEX  ? ?   ?Component Value Date/Time  ? CHOL 170 11/20/2021 0834  ? TRIG 154 (H) 11/20/2021 0834  ? HDL 41 11/20/2021 0834  ? CHOLHDL 4.1 11/20/2021 0834  ? CHOLHDL 4.3 05/12/2015 1122  ? VLDL 31 05/12/2015 1122  ? [place] 102 (H) 11/20/2021 2482  ? ? ?Hepatic Function Latest Ref Rng & Units 11/20/2021 04/24/2021 09/11/2018  ?Total Protein 6.0 - 8.5 g/dL 7.0 7.3 7.0  ?Albumin 3.8 - 4.8 g/dL 4.4 4.2 4.4  ?AST 0 - 40 IU/L _0 ?ALT 0 - 32 IU/L _1 ?Alk Phosphatase 44 - 121 IU/L 79 70 88  ?Total Bilirubin 0.0 - 1.2 mg/dL 0.4 0.7 0.3  ?Bilirubin, Direct 0.00 - 0.40 mg/dL 0.11 - 0.10  ? ? ?Lab Results  ?Component Value Date/Time  ? TSH 1.360 09/11/2018 09:49 AM  ? TSH 0.703 11/01/2016 10:00 AM  ? FREET4 1.02 11/01/2016 10:00 AM  ? ? ?CBC Latest Ref Rng & Units 04/24/2021 09/11/2018 11/01/2016  ?WBC 3.4 - 10.8 x10E3/uL 6.2 6.5 5.5  ?Hemoglobin 11.1 - 15.9 g/dL 11.1 12.0 11.4  ?Hematocrit 34.0 - 46.6 % 33.8(L) 37.3 34.7  ?Platelets 150 - 450 x10E3/uL 161 174 153  ? ? ?No results found  for: VD25OH ? ?Clinical ASCVD: Yes  ?The ASCVD Risk score (Arnett DK, et al., 2019) failed to calculate for the following reasons: ?  The patient has a prior MI or stroke diagnosis   ? ?Social History  ? ?Tobacco Use  ?Smoking Status Former  ? Packs/day: 2.00  ? Years: 15.00  ? Pack years: 30.00  ? Types: Cigarettes  ? Start date: 08/28/1973  ? Quit date: 10/18/1997  ? Years since quitting: 24.3  ?Smokeless Tobacco Never  ?Tobacco Comments  ? Quit smoking x 15 years, 05-31-2016 per pt no.  ? ?BP  Readings from Last 3 Encounters:  ?08/13/21 107/64  ?07/09/21 (!) 148/95  ?05/01/21 127/83  ? ?Pulse Readings from Last 3 Encounters:  ?08/13/21 (!) 58  ?07/09/21 74  ?05/01/21 70  ? ?Wt Readings from Last 3 Encounters:  ?08/13/21 165 lb (74.8 kg)  ?07/09/21 165 lb 9.6 oz (75.1 kg)  ?05/01/21 167 lb (75.8 kg)  ? ? ?Assessment: Review of patient past medical history, allergies, medications, health status, including review of consultants reports, laboratory and other test data, was performed as part of comprehensive evaluation and provision of chronic care management services.  ? ?SDOH:  (Social Determinants of Health) assessments and interventions performed:  ? ? ?CCM Care Plan ? ?Allergies  ?Allergen Reactions  ? Klonopin [Clonazepam]   ?  Made her mean  ? ? ?Medications Reviewed Today   ? ? Reviewed by Beryle Lathe, [facility] (Pharmacist) on 03/05/22 at 1312  Med List Status: <None>  ? ?Medication Order Taking? Sig Documenting Provider Last Dose Status Informant  ?aspirin 81 MG EC tablet [id number] Yes Take 81 mg by mouth daily. Swallow whole. [provider] Taking Active   ?atorvastatin (LIPITOR) 40 MG tablet [id number] Yes TAKE 1 TABLET BY MOUTH DAILY Luking, Elayne Snare, MD Taking Active   ?DULoxetine (CYMBALTA) 60 MG capsule [id number] Yes TAKE 1 CAPSULE BY MOUTH DAILY Luking, Scott A, MD Taking Active   ?ezetimibe (ZETIA) 10 MG tablet [id number] Yes Take 1 tablet (10 mg total) by mouth daily. Kathyrn Drown, MD Taking Active   ?levETIRAcetam (KEPPRA) 500 MG tablet [id number] Yes TAKE 1 TABLET BY MOUTH TWICE  DAILY Luking, Scott A, MD Taking Active   ?lisinopril (ZESTRIL) 10 MG tablet [id number] Yes TAKE 1 TABLET BY MOUTH DAILY Luking, Elayne Snare, MD Taking Active   ?Lysine 500 MG TABS [id number] Yes Take 500 mg by mouth daily as needed (outbreak). [provider] Taking Active   ?         ?Med Note Wilmon Pali, MELISSA R   Tue Aug 04, 2021 12:25 PM) Takes when eating tomatoes   ?melatonin 1 MG TABS  tablet [id number] Yes Take 1 mg by mouth at bedtime. [provider] Taking Active   ?metoprolol succinate (TOPROL-XL) 25 MG 24 hr tablet [id number] Yes TAKE 1 TABLET BY MOUTH ONCE  DAILY Luking, Scott A, MD Taking Active   ?omeprazole (PRILOSEC) 40 MG capsule [id number] Yes TAKE 1 CAPSULE BY MOUTH  DAILY Luking, Elayne Snare, MD Taking Active   ?potassium chloride (KLOR-CON M) 10 MEQ tablet [id number] Yes Take one tablet by mouth on Monday, Wednesday, and Friday Kathyrn Drown, MD Taking Active   ? ?  ?  ? ?  ? ? ?Patient Active Problem List  ? Diagnosis Date Noted  ? Ingrown right big toenail 12/30/2016  ? Major depression 05/03/2016  ? History of melanoma 11/03/2015  ? Reflux esophagitis   ? History of colonic  polyps   ? Heme positive stool 02/14/2015  ? Anorexia nervosa 02/14/2015  ? Loss of weight 02/14/2015  ? Constipation 02/14/2015  ? Exposure to hepatitis C 02/14/2015  ? Prediabetes 02/14/2015  ? Normocytic anemia 12/30/2014  ? Chronic bilateral low back pain without sciatica 03/29/2014  ? HTN (hypertension), benign 01/02/2014  ? Hyperlipidemia 01/02/2014  ? History of malignant melanoma of skin 01/02/2014  ? NSTEMI (non-ST elevated myocardial infarction) ([place]) 10/18/2013  ?  Class: Acute  ? Seizure disorder ([place]) 10/18/2013  ?  Class: Acute  ? Bipolar disorder ([place]) 10/18/2013  ?  Class: Chronic  ? ? ?Immunization History  ?Administered Date(s) Administered  ? Influenza,inj,Quad PF,6+ Mos 10/19/2013, 09/24/2014, 11/03/2017, 11/06/2019, 10/24/2020  ? Janssen (J&J) SARS-COV-2 Vaccination 08/20/2020  ? Pneumococcal Polysaccharide-23 02/04/2016  ? ? ?Conditions to be addressed/monitored: ?HTN, HLD, and Depression ? ?Care Plan : Medication Management  ?Updates made by Beryle Lathe, [place] since 03/05/2022 12:00 AM  ?  ? ?Problem: HTN, HLD, Depression   ?Priority: High  ?Onset Date: 12/11/2021  ?  ? ?Long-Range Goal: Disease Progression Prevention   ?Start Date: 12/11/2021  ?Expected End Date: 03/11/2022   ?Recent Progress: On track  ?Priority: High  ?Note:   ?Current Barriers:  ?Unable to achieve control of hyperlipidemia and mental health ? ?Pharmacist Clinical Goal(s):  ?Through collaboration with PharmD and provider,

## 2022-03-10 DIAGNOSIS — E7849 Other hyperlipidemia: Secondary | ICD-10-CM | POA: Diagnosis not present

## 2022-03-11 LAB — LIPID PANEL
Chol/HDL Ratio: 2.8 ratio (ref 0.0–4.4)
Cholesterol, Total: 143 mg/dL (ref 100–199)
HDL: 52 mg/dL (ref >39)
LDL Chol Calc (NIH): 72 mg/dL (ref 0–99)
Triglycerides: 103 mg/dL (ref 0–149)
VLDL Cholesterol Cal: 19 mg/dL (ref 5–40)

## 2022-03-19 DIAGNOSIS — F332 Major depressive disorder, recurrent severe without psychotic features: Secondary | ICD-10-CM

## 2022-03-19 DIAGNOSIS — I10 Essential (primary) hypertension: Secondary | ICD-10-CM

## 2022-03-19 DIAGNOSIS — E7849 Other hyperlipidemia: Secondary | ICD-10-CM

## 2022-03-31 ENCOUNTER — Telehealth: Payer: Self-pay | Admitting: Family Medicine

## 2022-03-31 NOTE — Telephone Encounter (Signed)
?  Left message for patient to call back and schedule Medicare Annual Wellness Visit (AWV) in office.  ? ?If unable to come into the office for AWV,  please offer to do virtually or by telephone. ? ?No hx of AWV eligible for AWVI per [place] as of 12/20/2009 ? ?Please schedule at anytime with [facility] Advisor.     ? ?45 minute appointment  ? ?Any questions, please call me at [phone]   ?Left message for patient to call back and schedule Medicare Annual Wellness Visit (AWV) in office.  ? ?If unable to come into the office for AWV,  please offer to do virtually or by telephone. ? ?No hx of AWV eligible for AWVI per [place] as of  ? ?Please schedule at anytime with [facility] Health Advisor.     ? ?45 minute appointment  ? ?Any questions, please call me at [phone]   ?

## 2022-04-30 ENCOUNTER — Encounter: Payer: Medicare Other | Admitting: Family Medicine

## 2022-05-03 ENCOUNTER — Other Ambulatory Visit: Payer: Self-pay | Admitting: Family Medicine

## 2022-06-02 ENCOUNTER — Other Ambulatory Visit: Payer: Self-pay | Admitting: Family Medicine

## 2022-06-03 ENCOUNTER — Other Ambulatory Visit: Payer: Self-pay | Admitting: Family Medicine

## 2022-06-11 ENCOUNTER — Encounter: Payer: Self-pay | Admitting: Family Medicine

## 2022-06-14 ENCOUNTER — Ambulatory Visit (INDEPENDENT_AMBULATORY_CARE_PROVIDER_SITE_OTHER): Payer: Medicare Other | Admitting: Nurse Practitioner

## 2022-06-14 ENCOUNTER — Encounter: Payer: Self-pay | Admitting: Nurse Practitioner

## 2022-06-14 VITALS — BP 138/98 | HR 64 | Temp 97.0°F | Ht 68.0 in | Wt 167.8 lb

## 2022-06-14 DIAGNOSIS — E7849 Other hyperlipidemia: Secondary | ICD-10-CM

## 2022-06-14 DIAGNOSIS — R5382 Chronic fatigue, unspecified: Secondary | ICD-10-CM

## 2022-06-14 DIAGNOSIS — I10 Essential (primary) hypertension: Secondary | ICD-10-CM | POA: Diagnosis not present

## 2022-06-14 DIAGNOSIS — Z0000 Encounter for general adult medical examination without abnormal findings: Secondary | ICD-10-CM | POA: Diagnosis not present

## 2022-06-14 DIAGNOSIS — R232 Flushing: Secondary | ICD-10-CM

## 2022-06-14 NOTE — Progress Notes (Signed)
Pt contacted and verbalized understanding. Pt is OK with referral to psych. Please advise. Thank you

## 2022-06-15 LAB — CBC WITH DIFFERENTIAL/PLATELET
Basophils Absolute: 0 10*3/uL (ref 0.0–0.2)
Basos: 1 %
EOS (ABSOLUTE): 0.2 10*3/uL (ref 0.0–0.4)
Eos: 4 %
Hematocrit: 38.2 % (ref 34.0–46.6)
Hemoglobin: 12.2 g/dL (ref 11.1–15.9)
Immature Grans (Abs): 0 10*3/uL (ref 0.0–0.1)
Immature Granulocytes: 0 %
Lymphocytes Absolute: 1.7 10*3/uL (ref 0.7–3.1)
Lymphs: 29 %
MCH: 30 pg (ref 26.6–33.0)
MCHC: 31.9 g/dL (ref 31.5–35.7)
MCV: 94 fL (ref 79–97)
Monocytes Absolute: 0.7 10*3/uL (ref 0.1–0.9)
Monocytes: 11 %
Neutrophils Absolute: 3.3 10*3/uL (ref 1.4–7.0)
Neutrophils: 55 %
Platelets: 180 10*3/uL (ref 150–450)
RBC: 4.06 x10E6/uL (ref 3.77–5.28)
RDW: 12.3 % (ref 11.7–15.4)
WBC: 5.9 10*3/uL (ref 3.4–10.8)

## 2022-06-15 LAB — CMP14+EGFR
ALT: 17 IU/L (ref 0–32)
AST: 17 IU/L (ref 0–40)
Albumin/Globulin Ratio: 1.4 (ref 1.2–2.2)
Albumin: 4 g/dL (ref 3.8–4.8)
Alkaline Phosphatase: 77 IU/L (ref 44–121)
BUN/Creatinine Ratio: 10 — ABNORMAL LOW (ref 12–28)
BUN: 10 mg/dL (ref 8–27)
Bilirubin Total: <0.2 mg/dL (ref 0.0–1.2)
CO2: 24 mmol/L (ref 20–29)
Calcium: 9.8 mg/dL (ref 8.7–10.3)
Chloride: 105 mmol/L (ref 96–106)
Creatinine, Ser: 1 mg/dL (ref 0.57–1.00)
Globulin, Total: 2.8 g/dL (ref 1.5–4.5)
Glucose: 85 mg/dL (ref 70–99)
Potassium: 4.2 mmol/L (ref 3.5–5.2)
Sodium: 144 mmol/L (ref 134–144)
Total Protein: 6.8 g/dL (ref 6.0–8.5)
eGFR: 64 mL/min/{1.73_m2} (ref >59)

## 2022-06-15 LAB — TSH+FREE T4
Free T4: 0.97 ng/dL (ref 0.82–1.77)
TSH: 2.96 u[IU]/mL (ref 0.450–4.500)

## 2022-06-15 LAB — LIPID PANEL
Chol/HDL Ratio: 3 ratio (ref 0.0–4.4)
Cholesterol, Total: 151 mg/dL (ref 100–199)
HDL: 50 mg/dL (ref >39)
LDL Chol Calc (NIH): 78 mg/dL (ref 0–99)
Triglycerides: 128 mg/dL (ref 0–149)
VLDL Cholesterol Cal: 23 mg/dL (ref 5–40)

## 2022-07-28 ENCOUNTER — Other Ambulatory Visit: Payer: Self-pay | Admitting: Family Medicine

## 2022-08-06 ENCOUNTER — Other Ambulatory Visit: Payer: Self-pay | Admitting: Family Medicine

## 2022-08-24 ENCOUNTER — Telehealth: Payer: Self-pay | Admitting: *Deleted

## 2022-08-24 NOTE — Telephone Encounter (Signed)
Patient states she would like a letter faxed to courthouse saying she can't do jury duty so they don't arrest her.Patient would like faxed to [phone]- she states the thought of being around people she does not know makes her nervous

## 2022-08-25 ENCOUNTER — Encounter: Payer: Self-pay | Admitting: Family Medicine

## 2022-08-25 NOTE — Telephone Encounter (Signed)
Letter was dictated please send/fax as requested and notify patient

## 2022-10-12 ENCOUNTER — Telehealth: Payer: Self-pay | Admitting: Family Medicine

## 2022-10-12 NOTE — Telephone Encounter (Signed)
?  Left message for patient to call back and schedule Medicare Annual Wellness Visit (AWV) in office.  ? ?If unable to come into the office for AWV,  please offer to do virtually or by telephone. ? ?No hx of AWV eligible for AWVI per [place] as of  12/20/2009 ? ?Please schedule at anytime with [facility] Advisor.     ? ?45 minute appointment  ? ?Any questions, please call me at [phone]   ?

## 2022-10-19 ENCOUNTER — Other Ambulatory Visit: Payer: Self-pay | Admitting: Family Medicine

## 2022-10-28 ENCOUNTER — Other Ambulatory Visit: Payer: Self-pay | Admitting: Family Medicine

## 2022-12-15 ENCOUNTER — Ambulatory Visit: Payer: Medicare Other | Admitting: Family Medicine

## 2022-12-30 ENCOUNTER — Other Ambulatory Visit: Payer: Self-pay | Admitting: Family Medicine

## 2023-01-06 ENCOUNTER — Other Ambulatory Visit: Payer: Self-pay | Admitting: Family Medicine

## 2023-01-10 ENCOUNTER — Ambulatory Visit: Payer: Medicare Other | Admitting: Family Medicine

## 2023-01-25 ENCOUNTER — Other Ambulatory Visit: Payer: Self-pay | Admitting: Family Medicine

## 2023-02-12 ENCOUNTER — Other Ambulatory Visit: Payer: Self-pay | Admitting: Family Medicine

## 2023-03-23 ENCOUNTER — Other Ambulatory Visit: Payer: Self-pay | Admitting: Family Medicine

## 2023-04-03 ENCOUNTER — Other Ambulatory Visit: Payer: Self-pay | Admitting: Family Medicine

## 2023-04-04 ENCOUNTER — Ambulatory Visit (INDEPENDENT_AMBULATORY_CARE_PROVIDER_SITE_OTHER): Payer: 59 | Admitting: Family Medicine

## 2023-04-04 VITALS — BP 123/85 | HR 66 | Ht 68.0 in | Wt 182.2 lb

## 2023-04-04 DIAGNOSIS — I10 Essential (primary) hypertension: Secondary | ICD-10-CM

## 2023-04-04 DIAGNOSIS — F3176 Bipolar disorder, in full remission, most recent episode depressed: Secondary | ICD-10-CM

## 2023-04-04 DIAGNOSIS — Z1231 Encounter for screening mammogram for malignant neoplasm of breast: Secondary | ICD-10-CM

## 2023-04-04 DIAGNOSIS — E7849 Other hyperlipidemia: Secondary | ICD-10-CM

## 2023-04-04 DIAGNOSIS — G40909 Epilepsy, unspecified, not intractable, without status epilepticus: Secondary | ICD-10-CM

## 2023-04-04 MED ORDER — LEVETIRACETAM 500 MG PO TABS: 500.0000 mg | ORAL_TABLET | Freq: Two times a day (BID) | ORAL | 1 refills | Status: DC

## 2023-04-04 MED ORDER — ATORVASTATIN CALCIUM 40 MG PO TABS: 40.0000 mg | ORAL_TABLET | Freq: Every day | ORAL | 1 refills | Status: DC

## 2023-04-04 MED ORDER — METOPROLOL SUCCINATE ER 25 MG PO TB24: 25.0000 mg | ORAL_TABLET | Freq: Every day | ORAL | 1 refills | Status: DC

## 2023-04-04 MED ORDER — DULOXETINE HCL 60 MG PO CPEP: 60.0000 mg | ORAL_CAPSULE | Freq: Every day | ORAL | 1 refills | Status: DC

## 2023-04-04 MED ORDER — OMEPRAZOLE 40 MG PO CPDR: 40.0000 mg | DELAYED_RELEASE_CAPSULE | Freq: Every day | ORAL | 1 refills | Status: DC

## 2023-04-04 MED ORDER — LISINOPRIL 10 MG PO TABS: 10.0000 mg | ORAL_TABLET | Freq: Every day | ORAL | 1 refills | Status: DC

## 2023-04-04 NOTE — Progress Notes (Signed)
   Subjective:    Patient ID: Tara Dickson, female    DOB: 08-18-1961, 62 y.o.   MRN: [id number]  HPI Patient arrives today for medication check.Patient state she has a place on her skin that is located on the  lower left back she is concerned about. She states she noticed this about a few months ago Other hyperlipidemia - Plan: Lipid panel  HTN (hypertension), benign - Plan: Basic Metabolic Panel (7), Hepatic Function Panel  Encounter for screening mammogram for malignant neoplasm of breast - Plan: MM Digital Screening  Bipolar disorder, in full remission, most recent episode depressed, Chronic  Seizure disorder, Chronic  Patient here for follow-up regarding cholesterol.    Patient relates taking medication on a regular basis Denies problems with medication Importance of dietary measures discussed Regular lab work regarding lipid and liver was checked and if needing additional labs was appropriately ordered  Patient for blood pressure check up.  The patient does have hypertension.   Patient relates dietary measures try to minimize salt The importance of healthy diet and activity were discussed Patient relates compliance  She has a seizure disorder takes medicine has not had any recent seizures  She also has mood disorder with a history of bipolar under good control currently she finds herself more likely to just stay at home does not want to get out and be around others  Review of Systems     Objective:   Physical Exam General-in no acute distress Eyes-no discharge Lungs-respiratory rate normal, CTA CV-no murmurs,RRR Extremities skin warm dry no edema Neuro grossly normal Behavior normal, alert Seborrheic keratosis on the chest and the back the one on the chest is brown the one on the back is flesh-colored     Assessment & Plan:  1. Other hyperlipidemia Continue medication healthy diet check labs - Lipid panel  2. HTN (hypertension), benign Blood pressure  good control continue current medication - Basic Metabolic Panel (7) - Hepatic Function Panel  3. Encounter for screening mammogram for malignant neoplasm of breast Screening mammo - MM Digital Screening  4. Bipolar disorder, in full remission, most recent episode depressed Continue antidepressant patient feels she is doing well currently  5. Seizure disorder No seizures recently medications renewed  Follow-up 6 months  Seborrheic keratosis not cancerous recommend follow-up within 6 months

## 2023-04-05 ENCOUNTER — Encounter: Payer: Self-pay | Admitting: Family Medicine

## 2023-04-05 LAB — HEPATIC FUNCTION PANEL
ALT: 24 IU/L (ref 0–32)
AST: 26 IU/L (ref 0–40)
Albumin: 4.4 g/dL (ref 3.9–4.9)
Alkaline Phosphatase: 77 IU/L (ref 44–121)
Bilirubin Total: 0.4 mg/dL (ref 0.0–1.2)
Bilirubin, Direct: 0.15 mg/dL (ref 0.00–0.40)
Total Protein: 7.3 g/dL (ref 6.0–8.5)

## 2023-04-05 LAB — BASIC METABOLIC PANEL (7)
BUN/Creatinine Ratio: 12 (ref 12–28)
BUN: 12 mg/dL (ref 8–27)
CO2: 23 mmol/L (ref 20–29)
Chloride: 107 mmol/L — ABNORMAL HIGH (ref 96–106)
Creatinine, Ser: 1 mg/dL (ref 0.57–1.00)
Glucose: 87 mg/dL (ref 70–99)
Potassium: 4.6 mmol/L (ref 3.5–5.2)
Sodium: 145 mmol/L — ABNORMAL HIGH (ref 134–144)
eGFR: 64 mL/min/{1.73_m2} (ref >59)

## 2023-04-05 LAB — LIPID PANEL
Chol/HDL Ratio: 3.3 ratio (ref 0.0–4.4)
Cholesterol, Total: 153 mg/dL (ref 100–199)
HDL: 46 mg/dL (ref >39)
LDL Chol Calc (NIH): 82 mg/dL (ref 0–99)
Triglycerides: 141 mg/dL (ref 0–149)
VLDL Cholesterol Cal: 25 mg/dL (ref 5–40)

## 2023-04-05 NOTE — Progress Notes (Signed)
Plz mail

## 2023-04-24 ENCOUNTER — Encounter: Payer: Self-pay | Admitting: Family Medicine

## 2023-04-24 NOTE — Progress Notes (Signed)
Please mail to the patient 

## 2023-10-04 ENCOUNTER — Ambulatory Visit: Payer: 59 | Admitting: Family Medicine

## 2023-10-09 ENCOUNTER — Other Ambulatory Visit: Payer: Self-pay | Admitting: Family Medicine

## 2023-10-26 ENCOUNTER — Other Ambulatory Visit: Payer: Self-pay | Admitting: Family Medicine

## 2023-11-04 ENCOUNTER — Encounter: Payer: Self-pay | Admitting: Family Medicine

## 2023-11-04 ENCOUNTER — Ambulatory Visit: Payer: 59 | Admitting: Family Medicine

## 2023-11-04 VITALS — BP 131/85 | HR 64 | Ht 68.0 in | Wt 193.0 lb

## 2023-11-04 DIAGNOSIS — Z23 Encounter for immunization: Secondary | ICD-10-CM

## 2023-11-04 DIAGNOSIS — G4700 Insomnia, unspecified: Secondary | ICD-10-CM | POA: Diagnosis not present

## 2023-11-04 DIAGNOSIS — F3176 Bipolar disorder, in full remission, most recent episode depressed: Secondary | ICD-10-CM

## 2023-11-04 DIAGNOSIS — R635 Abnormal weight gain: Secondary | ICD-10-CM | POA: Diagnosis not present

## 2023-11-04 MED ORDER — LISINOPRIL 10 MG PO TABS: 10.0000 mg | ORAL_TABLET | Freq: Every day | ORAL | 1 refills | Status: DC

## 2023-11-04 MED ORDER — DULOXETINE HCL 30 MG PO CPEP: 30.0000 mg | ORAL_CAPSULE | Freq: Every day | ORAL | 1 refills | Status: DC

## 2023-11-04 MED ORDER — TRAZODONE HCL 50 MG PO TABS: 25.0000 mg | ORAL_TABLET | Freq: Every evening | ORAL | 1 refills | Status: DC | PRN
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
## 2023-12-10 ENCOUNTER — Other Ambulatory Visit: Payer: Self-pay | Admitting: Family Medicine

## 2024-04-10 ENCOUNTER — Other Ambulatory Visit: Payer: Self-pay | Admitting: Family Medicine

## 2024-04-11 ENCOUNTER — Telehealth: Payer: Self-pay | Admitting: Family Medicine

## 2024-04-11 NOTE — Telephone Encounter (Signed)
 Patient will need follow-up visit somewhere between the end of May and mid July She has not already set up an appointment Please connect with her to set her up an appointment thank you

## 2024-05-03 ENCOUNTER — Ambulatory Visit: Payer: 59 | Admitting: Family Medicine

## 2024-05-17 ENCOUNTER — Ambulatory Visit: Admitting: Family Medicine

## 2024-05-19 ENCOUNTER — Other Ambulatory Visit: Payer: Self-pay | Admitting: Family Medicine

## 2024-06-15 ENCOUNTER — Other Ambulatory Visit: Payer: Self-pay | Admitting: Family Medicine

## 2024-07-14 ENCOUNTER — Other Ambulatory Visit: Payer: Self-pay | Admitting: Family Medicine

## 2024-07-16 ENCOUNTER — Other Ambulatory Visit: Payer: Self-pay

## 2024-07-16 MED ORDER — ATORVASTATIN CALCIUM 40 MG PO TABS: 40.0000 mg | ORAL_TABLET | Freq: Every day | ORAL | 2 refills | Status: DC

## 2024-07-16 MED ORDER — OMEPRAZOLE 40 MG PO CPDR: 40.0000 mg | DELAYED_RELEASE_CAPSULE | Freq: Every day | ORAL | 2 refills | Status: DC

## 2024-07-16 MED ORDER — METOPROLOL SUCCINATE ER 25 MG PO TB24: 25.0000 mg | ORAL_TABLET | Freq: Every day | ORAL | 2 refills | Status: DC

## 2024-08-22 ENCOUNTER — Other Ambulatory Visit: Payer: Self-pay | Admitting: Family Medicine

## 2024-08-28 ENCOUNTER — Ambulatory Visit: Admitting: Family Medicine

## 2024-08-28 ENCOUNTER — Encounter: Payer: Self-pay | Admitting: Family Medicine

## 2024-08-28 VITALS — BP 130/88 | HR 100 | Temp 97.3°F | Ht 68.0 in | Wt 201.0 lb

## 2024-08-28 DIAGNOSIS — R635 Abnormal weight gain: Secondary | ICD-10-CM

## 2024-08-28 DIAGNOSIS — Z79899 Other long term (current) drug therapy: Secondary | ICD-10-CM

## 2024-08-28 DIAGNOSIS — G40909 Epilepsy, unspecified, not intractable, without status epilepticus: Secondary | ICD-10-CM

## 2024-08-28 DIAGNOSIS — E7849 Other hyperlipidemia: Secondary | ICD-10-CM

## 2024-08-28 DIAGNOSIS — I10 Essential (primary) hypertension: Secondary | ICD-10-CM

## 2024-08-28 DIAGNOSIS — T148XXA Other injury of unspecified body region, initial encounter: Secondary | ICD-10-CM

## 2024-08-28 DIAGNOSIS — Z23 Encounter for immunization: Secondary | ICD-10-CM

## 2024-08-28 DIAGNOSIS — G4700 Insomnia, unspecified: Secondary | ICD-10-CM | POA: Diagnosis not present

## 2024-08-28 DIAGNOSIS — S0031XA Abrasion of nose, initial encounter: Secondary | ICD-10-CM | POA: Diagnosis not present

## 2024-08-28 DIAGNOSIS — F3176 Bipolar disorder, in full remission, most recent episode depressed: Secondary | ICD-10-CM

## 2024-08-28 MED ORDER — METOPROLOL SUCCINATE ER 25 MG PO TB24: 25.0000 mg | ORAL_TABLET | Freq: Every day | ORAL | 2 refills | Status: AC

## 2024-08-28 MED ORDER — OMEPRAZOLE 40 MG PO CPDR: 40.0000 mg | DELAYED_RELEASE_CAPSULE | Freq: Every day | ORAL | 2 refills | Status: AC

## 2024-08-28 NOTE — Progress Notes (Signed)
 Subjective:    Patient ID: Tara Dickson, female    DOB: March 05, 1961, 63 y.o.   MRN: [id number]  HPI follow up   Bipolar-she states is under good control she states her moods are doing well she is taking her medicines she has limited funds limited transportation  Seizure disorder is her medicine denies any seizures recently  HTN states she is overeating she is going to try to cut back on dietary measures and be more active she does not want to go up on her blood pressure medicine currently  Hyperlipidemia she does take her medicines will do lab work on a regular basis  Tara Dickson is a 63 year old female who presents with low energy and medication management issues.  She experiences low energy levels, describing them as 'spurts' of energy. It takes her longer to complete household tasks, although she is usually able to finish them. She is primarily responsible for household chores and often has to remind her roommates to contribute.  She uses a seven-day planner for her medications, including metoprolol . She encountered an issue where the medication appeared altered in the planner, making her hesitant to take it. She also ran out of heartburn medication and found over-the-counter alternatives ineffective.  She has difficulty sleeping, struggling to fall asleep but sleeping deeply once she does. She uses Z-Quil and melatonin for relaxation before bed and no longer takes trazodone  as it was ineffective.  She is experiencing financial strain due to property taxes, plumbing work, and the need to replace her [facility] heating unit. Her roommates contribute financially, with one providing $200 monthly and both receiving food stamps. One roommate is trying to get disability benefits.  She has a history of seizures and is diligent about taking her seizure medication. No recent seizures have occurred. She mentions gaining 40 pounds and discusses her diet, noting that her last meal is typically at  4 PM, but she often snacks later in the evening.  She has a small red dot on her nose from a dog scratch, attributed to her [place]. She lives with multiple pets, including a cat and a rescue dog, and describes their sleeping arrangements and behaviors  Review of Systems     Objective:   Physical Exam General-in no acute distress Eyes-no discharge Lungs-respiratory rate normal, CTA CV-no murmurs,RRR Extremities skin warm dry no edema Neuro grossly normal Behavior normal, alert Scrape noted on her nose       Assessment & Plan:   1. Weight gain (Primary) Weight gain is due to not following her diet as well as she should she is can do a better job of portion control regular physical activity try to bring her weight down 10 to 20 pounds over the next 6 months - Basic metabolic panel with GFR  2. HTN (hypertension), benign Blood pressure under good control check lab work await the results - Basic metabolic panel with GFR - Hepatic function panel  3. Other hyperlipidemia Labs ordered.  Await the results continue current medication healthy diet tolerates statin well - Lipid panel  4. Seizure disorder [facility]) Takes her medicine no seizures recently check level - Levetiracetam  level  5. Insomnia, unspecified type She is good deal with this Vesicare and there is really no medicine that is good to help other than possibly melatonin currently she uses that along with another OTC she was taking trazodone  but it did not help  6. Immunization due Today - Pneumococcal conjugate vaccine 20-valent - Flu  vaccine trivalent PF, 6mos and older(Flulaval,Afluria,Fluarix,Fluzone)  7. Abrasion Should heal up on its own  Bipolar actually doing well she is on medications does not need to see psychiatry she has limited funds and limited transportation so we try to manage all these issues the best we can Labs were ordered

## 2024-10-29 ENCOUNTER — Other Ambulatory Visit: Payer: Self-pay

## 2024-10-29 NOTE — Progress Notes (Signed)
 Pharmacy Quality Measure Review  This patient is appearing on a report for being at risk of failing the adherence measure for cholesterol (statin) medications this calendar year.   Medication: atorvastatin  40 mg  Last fill date: 05/10/24 for 100 day supply  Per chart review, the prescription for atorvastatin  was sent to [facility]. Called patient to verify current pharmacy, which is now [facility]. She denies any adverse effects (e.g., leg cramps, muscle pains) or known missed doses. She does have a weekly pill organizer that she uses daily. She states that she should have some atorvastatin  remaining; however, she was unable to verify.   Contacted [facility]; they do have a prescription for atorvastatin , but it is expired. Will collaborate with the embedded pharmacist to send a new prescription to [facility]. Updated preferred pharmacy list.   Woodie Jock, PharmD PGY1 Pharmacy Resident  10/29/2024

## 2024-11-05 ENCOUNTER — Telehealth: Payer: Self-pay | Admitting: Pharmacist

## 2024-11-05 MED ORDER — ATORVASTATIN CALCIUM 40 MG PO TABS: 40.0000 mg | ORAL_TABLET | Freq: Every day | ORAL | 2 refills | Status: AC

## 2024-11-05 NOTE — Telephone Encounter (Signed)
 Pharmacy Quality Measure Review   This patient is appearing on a report for being at risk of failing the adherence measure for cholesterol (statin) medications this calendar year.   Medication: atorvastatin  40 mg  Last fill date: 05/10/24 for 100 day supply   From Katrina, PharmD Per chart review, the prescription for atorvastatin  was sent to [facility]. Called patient to verify current pharmacy, which is now [facility]. She denies any adverse effects (e.g., leg cramps, muscle pains) or known missed doses. She does have a weekly pill organizer that she uses daily. She states that she should have some atorvastatin  remaining; however, she was unable to verify.   New atorvastatin  RX sent to [facility] per patient and insurance preference.  Appreciate assistance  Mliss Tarry Griffin, PharmD, BCACP, CPP Clinical Pharmacist, [facility]

## 2025-01-01 ENCOUNTER — Other Ambulatory Visit: Payer: Self-pay | Admitting: Family Medicine

## 2025-01-29 ENCOUNTER — Ambulatory Visit: Admitting: Family Medicine
